# Patient Record
Sex: Male | Born: 1950 | Race: White | Hispanic: No | Smoking: Never smoker
Health system: Southern US, Community
[De-identification: ages and names within clinical notes are randomized; demographics above are authoritative.]

## PROBLEM LIST (undated history)

## (undated) DIAGNOSIS — M179 Osteoarthritis of knee, unspecified: Secondary | ICD-10-CM

## (undated) DIAGNOSIS — M1710 Unilateral primary osteoarthritis, unspecified knee: Secondary | ICD-10-CM

## (undated) DIAGNOSIS — M5430 Sciatica, unspecified side: Secondary | ICD-10-CM

## (undated) HISTORY — PX: KNEE SURGERY: SHX244

---

## 2011-03-28 ENCOUNTER — Emergency Department: Payer: Self-pay | Admitting: Emergency Medicine

## 2020-01-11 ENCOUNTER — Emergency Department: Payer: Medicare PPO

## 2020-01-11 ENCOUNTER — Inpatient Hospital Stay
Admission: EM | Admit: 2020-01-11 | Discharge: 2020-01-15 | DRG: 871 | Disposition: A | Discharge status: Skilled Nursing Facility | Payer: Medicare PPO | Attending: Internal Medicine | Admitting: Internal Medicine

## 2020-01-11 ENCOUNTER — Other Ambulatory Visit: Payer: Self-pay

## 2020-01-11 ENCOUNTER — Encounter: Payer: Self-pay | Admitting: Emergency Medicine

## 2020-01-11 DIAGNOSIS — M25511 Pain in right shoulder: Secondary | ICD-10-CM | POA: Diagnosis present

## 2020-01-11 DIAGNOSIS — R778 Other specified abnormalities of plasma proteins: Secondary | ICD-10-CM | POA: Diagnosis not present

## 2020-01-11 DIAGNOSIS — W010XXA Fall on same level from slipping, tripping and stumbling without subsequent striking against object, initial encounter: Secondary | ICD-10-CM | POA: Diagnosis present

## 2020-01-11 DIAGNOSIS — Z23 Encounter for immunization: Secondary | ICD-10-CM

## 2020-01-11 DIAGNOSIS — J189 Pneumonia, unspecified organism: Secondary | ICD-10-CM | POA: Diagnosis present

## 2020-01-11 DIAGNOSIS — Y92481 Parking lot as the place of occurrence of the external cause: Secondary | ICD-10-CM

## 2020-01-11 DIAGNOSIS — I248 Other forms of acute ischemic heart disease: Secondary | ICD-10-CM | POA: Diagnosis present

## 2020-01-11 DIAGNOSIS — R059 Cough, unspecified: Secondary | ICD-10-CM

## 2020-01-11 DIAGNOSIS — R609 Edema, unspecified: Secondary | ICD-10-CM

## 2020-01-11 DIAGNOSIS — R296 Repeated falls: Secondary | ICD-10-CM | POA: Diagnosis present

## 2020-01-11 DIAGNOSIS — S80212A Abrasion, left knee, initial encounter: Secondary | ICD-10-CM | POA: Diagnosis present

## 2020-01-11 DIAGNOSIS — M1712 Unilateral primary osteoarthritis, left knee: Secondary | ICD-10-CM | POA: Diagnosis present

## 2020-01-11 DIAGNOSIS — N170 Acute kidney failure with tubular necrosis: Secondary | ICD-10-CM | POA: Diagnosis present

## 2020-01-11 DIAGNOSIS — Z9181 History of falling: Secondary | ICD-10-CM

## 2020-01-11 DIAGNOSIS — M19011 Primary osteoarthritis, right shoulder: Secondary | ICD-10-CM | POA: Diagnosis present

## 2020-01-11 DIAGNOSIS — M75121 Complete rotator cuff tear or rupture of right shoulder, not specified as traumatic: Secondary | ICD-10-CM | POA: Diagnosis present

## 2020-01-11 DIAGNOSIS — M7531 Calcific tendinitis of right shoulder: Secondary | ICD-10-CM | POA: Diagnosis present

## 2020-01-11 DIAGNOSIS — Z20822 Contact with and (suspected) exposure to covid-19: Secondary | ICD-10-CM | POA: Diagnosis present

## 2020-01-11 DIAGNOSIS — M25462 Effusion, left knee: Secondary | ICD-10-CM | POA: Diagnosis present

## 2020-01-11 DIAGNOSIS — R7989 Other specified abnormal findings of blood chemistry: Secondary | ICD-10-CM | POA: Diagnosis present

## 2020-01-11 DIAGNOSIS — N179 Acute kidney failure, unspecified: Secondary | ICD-10-CM | POA: Diagnosis not present

## 2020-01-11 DIAGNOSIS — R6520 Severe sepsis without septic shock: Secondary | ICD-10-CM | POA: Diagnosis not present

## 2020-01-11 DIAGNOSIS — A419 Sepsis, unspecified organism: Principal | ICD-10-CM

## 2020-01-11 DIAGNOSIS — M25562 Pain in left knee: Secondary | ICD-10-CM

## 2020-01-11 DIAGNOSIS — R05 Cough: Secondary | ICD-10-CM

## 2020-01-11 HISTORY — DX: Sciatica, unspecified side: M54.30

## 2020-01-11 HISTORY — DX: Unilateral primary osteoarthritis, unspecified knee: M17.10

## 2020-01-11 HISTORY — DX: Osteoarthritis of knee, unspecified: M17.9

## 2020-01-11 LAB — CK: Total CK: 101 U/L (ref 49–397)

## 2020-01-11 LAB — LACTIC ACID, PLASMA
Lactic Acid, Venous: 1.8 mmol/L (ref 0.5–1.9)
Lactic Acid, Venous: 2.1 mmol/L (ref 0.5–1.9)

## 2020-01-11 LAB — COMPREHENSIVE METABOLIC PANEL
ALT: 58 U/L — ABNORMAL HIGH (ref 0–44)
AST: 28 U/L (ref 15–41)
Albumin: 3.3 g/dL — ABNORMAL LOW (ref 3.5–5.0)
Alkaline Phosphatase: 152 U/L — ABNORMAL HIGH (ref 38–126)
Anion gap: 12 (ref 5–15)
BUN: 95 mg/dL — ABNORMAL HIGH (ref 8–23)
CO2: 22 mmol/L (ref 22–32)
Calcium: 10.3 mg/dL (ref 8.9–10.3)
Chloride: 100 mmol/L (ref 98–111)
Creatinine, Ser: 4.23 mg/dL — ABNORMAL HIGH (ref 0.61–1.24)
GFR calc Af Amer: 16 mL/min — ABNORMAL LOW (ref >60)
GFR calc non Af Amer: 13 mL/min — ABNORMAL LOW (ref >60)
Glucose, Bld: 108 mg/dL — ABNORMAL HIGH (ref 70–99)
Potassium: 4.3 mmol/L (ref 3.5–5.1)
Sodium: 134 mmol/L — ABNORMAL LOW (ref 135–145)
Total Bilirubin: 1.4 mg/dL — ABNORMAL HIGH (ref 0.3–1.2)
Total Protein: 7.4 g/dL (ref 6.5–8.1)

## 2020-01-11 LAB — CBC
HCT: 47.8 % (ref 39.0–52.0)
HCT: 42.7 % (ref 39.0–52.0)
Hemoglobin: 16.2 g/dL (ref 13.0–17.0)
Hemoglobin: 14.5 g/dL (ref 13.0–17.0)
MCH: 29.2 pg (ref 26.0–34.0)
MCH: 29.1 pg (ref 26.0–34.0)
MCHC: 33.9 g/dL (ref 30.0–36.0)
MCHC: 34 g/dL (ref 30.0–36.0)
MCV: 86.3 fL (ref 80.0–100.0)
MCV: 85.7 fL (ref 80.0–100.0)
Platelets: 148 10*3/uL — ABNORMAL LOW (ref 150–400)
Platelets: 125 10*3/uL — ABNORMAL LOW (ref 150–400)
RBC: 5.54 MIL/uL (ref 4.22–5.81)
RBC: 4.98 MIL/uL (ref 4.22–5.81)
RDW: 14.8 % (ref 11.5–15.5)
RDW: 14.8 % (ref 11.5–15.5)
WBC: 30.6 10*3/uL — ABNORMAL HIGH (ref 4.0–10.5)
WBC: 21.5 10*3/uL — ABNORMAL HIGH (ref 4.0–10.5)
nRBC: 0 % (ref 0.0–0.2)
nRBC: 0 % (ref 0.0–0.2)

## 2020-01-11 LAB — PROCALCITONIN: Procalcitonin: 41.58 ng/mL

## 2020-01-11 LAB — POC SARS CORONAVIRUS 2 AG: SARS Coronavirus 2 Ag: NEGATIVE

## 2020-01-11 LAB — TROPONIN I (HIGH SENSITIVITY)
Troponin I (High Sensitivity): 227 ng/L (ref <18)
Troponin I (High Sensitivity): 214 ng/L (ref <18)

## 2020-01-11 MED ORDER — SODIUM CHLORIDE 0.9 % IV SOLN
2.0000 g | INTRAVENOUS | Status: DC
  Administered 2020-01-11 – 2020-01-13 (×3): 2 g via INTRAVENOUS
  Filled 2020-01-11: qty 20
  Filled 2020-01-11: qty 2
  Filled 2020-01-11: qty 20
  Filled 2020-01-11: qty 2

## 2020-01-11 MED ORDER — SODIUM CHLORIDE 0.9 % IV SOLN
500.0000 mg | INTRAVENOUS | Status: DC
  Administered 2020-01-11 – 2020-01-13 (×3): 500 mg via INTRAVENOUS
  Filled 2020-01-11 (×5): qty 500

## 2020-01-11 MED ORDER — ONDANSETRON HCL 4 MG PO TABS: 4.0000 mg | ORAL_TABLET | Freq: Four times a day (QID) | ORAL | Status: DC | PRN

## 2020-01-11 MED ORDER — ONDANSETRON HCL 4 MG/2ML IJ SOLN: 4.0000 mg | Freq: Four times a day (QID) | INTRAMUSCULAR | Status: DC | PRN

## 2020-01-11 MED ORDER — LACTATED RINGERS IV BOLUS (SEPSIS)
800.0000 mL | Freq: Once | INTRAVENOUS | Status: AC
  Administered 2020-01-12: 01:00:00 800 mL via INTRAVENOUS

## 2020-01-11 MED ORDER — ACETAMINOPHEN 325 MG PO TABS
650.0000 mg | ORAL_TABLET | Freq: Four times a day (QID) | ORAL | Status: DC | PRN
  Administered 2020-01-13: 08:00:00 650 mg via ORAL
  Filled 2020-01-11: qty 2

## 2020-01-11 MED ORDER — SODIUM CHLORIDE 0.9 % IV SOLN: 500.0000 mg | INTRAVENOUS | Status: DC

## 2020-01-11 MED ORDER — SODIUM CHLORIDE 0.9 % IV SOLN: 2.0000 g | INTRAVENOUS | Status: DC

## 2020-01-11 MED ORDER — ACETAMINOPHEN 650 MG RE SUPP: 650.0000 mg | Freq: Four times a day (QID) | RECTAL | Status: DC | PRN

## 2020-01-11 MED ORDER — LACTATED RINGERS IV BOLUS (SEPSIS): 1000.0000 mL | Freq: Once | INTRAVENOUS | Status: DC

## 2020-01-11 MED ORDER — LACTATED RINGERS IV BOLUS (SEPSIS)
1000.0000 mL | Freq: Once | INTRAVENOUS | Status: AC
  Administered 2020-01-11: 20:00:00 1000 mL via INTRAVENOUS

## 2020-01-11 MED ORDER — SENNOSIDES-DOCUSATE SODIUM 8.6-50 MG PO TABS: 1.0000 | ORAL_TABLET | Freq: Every evening | ORAL | Status: DC | PRN

## 2020-01-11 MED ORDER — LACTATED RINGERS IV BOLUS (SEPSIS)
1000.0000 mL | Freq: Once | INTRAVENOUS | Status: AC
  Administered 2020-01-11: 23:00:00 1000 mL via INTRAVENOUS

## 2020-01-11 MED ORDER — ENOXAPARIN SODIUM 40 MG/0.4ML ~~LOC~~ SOLN: 40.0000 mg | SUBCUTANEOUS | Status: DC

## 2020-01-11 MED ORDER — SODIUM CHLORIDE 0.9 % IV SOLN: INTRAVENOUS | Status: DC

## 2020-01-11 NOTE — ED Notes (Signed)
Admitting MD at bedside.

## 2020-01-11 NOTE — H&P (Signed)
History and Physical    Danny Bowman QMG:[id number] DOB: October 20, 1951 DOA: 01/11/2020  PCP: [facility]   Patient coming from: home  I have personally briefly reviewed patient's old medical records in [place]  Chief Complaint: weakness, fall, right shoulder pain  HPI: Danny Bowman is a 69 y.o. male with no significant past medical history who presented initially to the emergency room by EMS following a fall in front of an urgent care center.  Patient apparently went because of right shoulder pain and weakness but the center was closed.While leaving , he fell and EMS brought him to the ER.On arrival it was revealed that patient has had a cough and fever for the past few days and reveals that he had been very weak of late, falling a few times and had three days of diarrhea that has since resolved.  He denied chest pain or shortness of breath or Covid contacts.  Denied nausea, vomiting diaphoresis and denies abdominal pain .  ED Course: Upon arrival in the emergency room he was afebrile at 98.3 with a blood pressure of 100/63.  He was tachycardic at 93, breathing at 18 with O2 sat of 92% on room air.  His blood work was significant for white cell count of 30,000 and a creatinine of 4.23, baseline unknown.  Lactic acid was 1.8 and procalcitonin 41.  He had an elevated troponin of 227 as well as elevated ALT of 58 alk phos of 152 and bilirubin 1.4.  Chest x-ray showed "Bronchitic lung changes with streaky airspace opacities in the medial right lower lung zone raises concern for developing Pneumonia" Right shoulder x-ray showed no acute injury but did show calcific tendinitis. Patient was started on fluid bolus as well as Rocephin and azithromycin for treatment of CAP.  Hospitalist consulted for admission  Review of Systems: As per HPI otherwise 10 point review of systems negative.    Past Medical History:  Diagnosis Date  . Osteoarthritis, knee   . Sciatica      Past Surgical History:  Procedure Laterality Date  . KNEE SURGERY       reports that he has never smoked. He has never used smokeless tobacco. He reports that he does not drink alcohol or use drugs.  No Known Allergies  History reviewed. No pertinent family history.   Prior to Admission medications   Not on File    Physical Exam: Vitals:   01/11/20 1757 01/11/20 1804 01/11/20 2000  BP:  100/63 121/80  Pulse:  93 88  Resp:  18 (!) 22  Temp:  98.3 F (36.8 C)   TempSrc:  Oral   SpO2:  92% 96%  Weight: 136.1 kg    Height: 6' 4"  (1.93 m)       Vitals:   01/11/20 1757 01/11/20 1804 01/11/20 2000  BP:  100/63 121/80  Pulse:  93 88  Resp:  18 (!) 22  Temp:  98.3 F (36.8 C)   TempSrc:  Oral   SpO2:  92% 96%  Weight: 136.1 kg    Height: 6' 4"  (1.93 m)      Constitutional: NAD, alert and oriented x 3 Eyes: PERRL, lids and conjunctivae normal ENMT: Mucous membranes are moist.  Neck: normal, supple, no masses, no thyromegaly Respiratory: clear to auscultation bilaterally, no wheezing, no crackles. Normal respiratory effort. No accessory muscle use.  Cardiovascular: Regular rate and rhythm, no murmurs / rubs / gallops. No extremity edema. 2+ pedal pulses. No  carotid bruits.  Abdomen: no tenderness, no masses palpated. No hepatosplenomegaly. Bowel sounds positive.  Musculoskeletal: no clubbing / cyanosis. No joint deformity upper and lower extremities.  Skin: no rashes, lesions, ulcers.  Neurologic: No gross focal neurologic deficit. Psychiatric: Normal mood and affect.   Labs on Admission: I have personally reviewed following labs and imaging studies  CBC: Recent Labs  Lab 01/11/20 1813  WBC 30.6*  HGB 16.2  HCT 47.8  MCV 86.3  PLT 536*   Basic Metabolic Panel: Recent Labs  Lab 01/11/20 1813  NA 134*  K 4.3  CL 100  CO2 22  GLUCOSE 108*  BUN 95*  CREATININE 4.23*  CALCIUM 10.3   GFR: Estimated Creatinine Clearance: 25.2 mL/min (A) (by C-G  formula based on SCr of 4.23 mg/dL (H)). Liver Function Tests: Recent Labs  Lab 01/11/20 1813  AST 28  ALT 58*  ALKPHOS 152*  BILITOT 1.4*  PROT 7.4  ALBUMIN 3.3*   No results for input(s): LIPASE, AMYLASE in the last 168 hours. No results for input(s): AMMONIA in the last 168 hours. Coagulation Profile: No results for input(s): INR, PROTIME in the last 168 hours. Cardiac Enzymes: Recent Labs  Lab 01/11/20 1948  CKTOTAL 101   BNP (last 3 results) No results for input(s): PROBNP in the last 8760 hours. HbA1C: No results for input(s): HGBA1C in the last 72 hours. CBG: No results for input(s): GLUCAP in the last 168 hours. Lipid Profile: No results for input(s): CHOL, HDL, LDLCALC, TRIG, CHOLHDL, LDLDIRECT in the last 72 hours. Thyroid Function Tests: No results for input(s): TSH, T4TOTAL, FREET4, T3FREE, THYROIDAB in the last 72 hours. Anemia Panel: No results for input(s): VITAMINB12, FOLATE, FERRITIN, TIBC, IRON, RETICCTPCT in the last 72 hours. Urine analysis: No results found for: COLORURINE, APPEARANCEUR, LABSPEC, [place], GLUCOSEU, HGBUR, BILIRUBINUR, KETONESUR, PROTEINUR, UROBILINOGEN, NITRITE, LEUKOCYTESUR  Radiological Exams on Admission: DG Chest 1 View  Result Date: 01/11/2020 CLINICAL DATA:  Shoulder pain. EXAM: CHEST  1 VIEW COMPARISON:  None. FINDINGS: There are bronchitic changes at the lung bases bilaterally with streaky airspace opacities in the medial right lower lung zone. The right hemidiaphragm is elevated. The heart size is normal. There is no pneumothorax. No large pleural effusion. No acute osseous abnormality. IMPRESSION: Bronchitic lung changes with streaky airspace opacities in the medial right lower lung zone raises concern for developing pneumonia. Electronically Signed   By: Constance Holster M.D.   On: 01/11/2020 18:57   DG Shoulder Right  Result Date: 01/11/2020 CLINICAL DATA:  Pain EXAM: RIGHT SHOULDER - 2+ VIEW COMPARISON:  None. FINDINGS:  There is no acute displaced fracture or dislocation. There is mild-to-moderate osteoarthritis of both the glenohumeral and acromioclavicular joints. There are findings of calcific tendinosis, likely of the supraspinatus. IMPRESSION: 1. No acute displaced fracture or dislocation. 2. Mild-to-moderate osteoarthritis. 3. 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 Electronically Signed   By: Constance Holster M.D.   On: 01/11/2020 18:56    EKG: Independently reviewed.   Assessment/Plan Principal Problem:    Sepsis ([place])   CAP (community acquired pneumonia) Patient met sepsis criteria with tachycardia, white cell count of 30,000, pneumonia on chest x-ray, acute kidney injury Continue sepsis fluid bolus and management per sepsis protocol IV Rocephin and azithromycin Follow blood cultures    AKI (acute kidney injury) ([place]) Secondary to sepsis as above IV hydration Monitor renal function and avoid nephrotoxins Consult nephrology if not responding to the above measures    Elevated troponin Suspect secondary to  demand ischemia from sepsis as patient has no chest pain Troponin trending down, 227>>214 EKG ordered. Monitor for chest pain.     Right shoulder pain Calcific tendinosis seen on x-ray but no acute injury Tylenol or ibuprofen as needed    Elevated LFTs Possibly related to sepsis Continue to monitor Further work-up if worsening  Generalized weakness and multiple falls Suspect related to generalized weakness from pneumonia and sepsis Patient has no focal deficits Physical therapy eval       DVT prophylaxis: lovenox  Code Status: full code  Family Communication: none  Disposition Plan: Back to previous home environment Consults called: none     Athena Masse MD [facility]     01/11/2020, 9:20 PM

## 2020-01-11 NOTE — ED Notes (Signed)
Dr Scotty Court aware of troponin 227

## 2020-01-11 NOTE — ED Provider Notes (Signed)
[facility] Emergency Department Provider Note  ____________________________________________  Time seen: Approximately 10:04 PM  I have reviewed the triage vital signs and the nursing notes.   HISTORY  Chief Complaint Shoulder Pain    HPI Danny Bowman is a 69 y.o. male with a history of sciatica who comes the ED complaining of right shoulder pain, worse with movement.  He went to urgent care earlier today for evaluation of the shoulder pain and then tripped and fell in the parking lot resulting in worsened pain.  He denies head injury or loss of consciousness.   He also notes that over the past 4 days he is been feeling weak/fatigued, loss of appetite, malaise, nausea.  Denies vomiting or diarrhea.  Does endorse fever and chills.  Past Medical History:  Diagnosis Date  . Osteoarthritis, knee   . Sciatica      Patient Active Problem List   Diagnosis Date Noted  . CAP (community acquired pneumonia) 01/11/2020  . AKI (acute kidney injury) ([facility]) 01/11/2020  . Elevated troponin 01/11/2020  . Right shoulder pain 01/11/2020  . Sepsis ([facility]) 01/11/2020  . Elevated LFTs 01/11/2020  . Multiple falls 01/11/2020     Past Surgical History:  Procedure Laterality Date  . KNEE SURGERY       Prior to Admission medications   Medication Sig Start Date End Date Taking? Authorizing Provider  naproxen sodium (ALEVE) 220 MG tablet Take 220 mg by mouth 2 (two) times daily as needed.   Yes [provider]     Allergies Patient has no known allergies.   History reviewed. No pertinent family history.  Social History Social History   Tobacco Use  . Smoking status: Never Smoker  . Smokeless tobacco: Never Used  Substance Use Topics  . Alcohol use: Never  . Drug use: Never    Review of Systems  Constitutional:   Positive fever and chills.  ENT:   No sore throat. No rhinorrhea. Cardiovascular:   No chest pain or syncope. Respiratory:   No  dyspnea or cough. Gastrointestinal:   Negative for abdominal pain, vomiting and diarrhea.  Decreased appetite, decreased oral intake Musculoskeletal:   Positive right shoulder pain All other systems reviewed and are negative except as documented above in ROS and HPI.  ____________________________________________   PHYSICAL EXAM:  VITAL SIGNS: ED Triage Vitals  Enc Vitals Group     BP 01/11/20 1804 100/63     Pulse Rate 01/11/20 1804 93     Resp 01/11/20 1804 18     Temp 01/11/20 1804 98.3 F (36.8 C)     Temp Source 01/11/20 1804 Oral     SpO2 01/11/20 1804 92 %     Weight 01/11/20 1757 300 lb (136.1 kg)     Height 01/11/20 1757 6\' 4"  (1.93 m)     Head Circumference --      Peak Flow --      Pain Score 01/11/20 1757 8     Pain Loc --      Pain Edu? --      Excl. in GC? --     Vital signs reviewed, nursing assessments reviewed.   Constitutional:   Alert and oriented. Non-toxic appearance. Eyes:   Conjunctivae are normal. EOMI. PERRL. ENT      Head:   Normocephalic and atraumatic.      Nose:   Wearing a mask.      Mouth/Throat:   Wearing a mask.  Neck:   No meningismus. Full ROM. Hematological/Lymphatic/Immunilogical:   No cervical lymphadenopathy. Cardiovascular:   RRR. Symmetric bilateral radial and DP pulses.  No murmurs. Cap refill less than 2 seconds. Respiratory:   Normal respiratory effort without tachypnea/retractions. Breath sounds are clear and equal bilaterally. No wheezes/rales/rhonchi. Gastrointestinal:   Soft and nontender. Non distended. There is no CVA tenderness.  No rebound, rigidity, or guarding.  Musculoskeletal:   Normal range of motion in all extremities. No joint effusions.  No lower extremity tenderness.  No edema.  No focal bony tenderness over the right shoulder.  Shoulder is located.  No extreme pain with passive range of motion. Neurologic:   Normal speech and language.  Motor grossly intact. No acute focal neurologic deficits are  appreciated.  Skin:    Skin is warm, dry and intact. No rash noted.  No petechiae, purpura, or bullae.  ____________________________________________    LABS (pertinent positives/negatives) (all labs ordered are listed, but only abnormal results are displayed) Labs Reviewed  CBC - Abnormal; Notable for the following components:      Result Value   WBC 30.6 (*)    Platelets 148 (*)    All other components within normal limits  COMPREHENSIVE METABOLIC PANEL - Abnormal; Notable for the following components:   Sodium 134 (*)    Glucose, Bld 108 (*)    BUN 95 (*)    Creatinine, Ser 4.23 (*)    Albumin 3.3 (*)    ALT 58 (*)    Alkaline Phosphatase 152 (*)    Total Bilirubin 1.4 (*)    GFR calc non Af Amer 13 (*)    GFR calc Af Amer 16 (*)    All other components within normal limits  LACTIC ACID, PLASMA - Abnormal; Notable for the following components:   Lactic Acid, Venous 2.1 (*)    All other components within normal limits  TROPONIN I (HIGH SENSITIVITY) - Abnormal; Notable for the following components:   Troponin I (High Sensitivity) 227 (*)    All other components within normal limits  TROPONIN I (HIGH SENSITIVITY) - Abnormal; Notable for the following components:   Troponin I (High Sensitivity) 214 (*)    All other components within normal limits  CULTURE, BLOOD (ROUTINE X 2)  CULTURE, BLOOD (ROUTINE X 2)  URINE CULTURE  LACTIC ACID, PLASMA  CK  PROCALCITONIN  URINALYSIS, COMPLETE (UACMP) WITH MICROSCOPIC  HIV ANTIBODY (ROUTINE TESTING W REFLEX)  PROTIME-INR  CORTISOL-AM, BLOOD  PROCALCITONIN  CBC  CBC  COMPREHENSIVE METABOLIC PANEL  POC SARS CORONAVIRUS 2 AG -  ED  POC SARS CORONAVIRUS 2 AG   ____________________________________________   EKG  Interpreted by me Normal sinus rhythm rate of 93.  Left axis, normal intervals.  Poor R wave progression.  Normal ST segments and T waves.  ____________________________________________    RADIOLOGY  DG Chest 1  View  Result Date: 01/11/2020 CLINICAL DATA:  Shoulder pain. EXAM: CHEST  1 VIEW COMPARISON:  None. FINDINGS: There are bronchitic changes at the lung bases bilaterally with streaky airspace opacities in the medial right lower lung zone. The right hemidiaphragm is elevated. The heart size is normal. There is no pneumothorax. No large pleural effusion. No acute osseous abnormality. IMPRESSION: Bronchitic lung changes with streaky airspace opacities in the medial right lower lung zone raises concern for developing pneumonia. Electronically Signed   By: Katherine Mantle M.D.   On: 01/11/2020 18:57   DG Shoulder Right  Result Date: 01/11/2020 CLINICAL DATA:  Pain EXAM: RIGHT SHOULDER - 2+ VIEW COMPARISON:  None. FINDINGS: There is no acute displaced fracture or dislocation. There is mild-to-moderate osteoarthritis of both the glenohumeral and acromioclavicular joints. There are findings of calcific tendinosis, likely of the supraspinatus. IMPRESSION: 1. No acute displaced fracture or dislocation. 2. Mild-to-moderate osteoarthritis. 3. 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 Electronically Signed   By: Katherine Mantle M.D.   On: 01/11/2020 18:56    ____________________________________________   PROCEDURES .Critical Care Performed by: Sharman Cheek, MD Authorized by: Sharman Cheek, MD   Critical care provider statement:    Critical care time (minutes):  35   Critical care time was exclusive of:  Separately billable procedures and treating other patients   Critical care was necessary to treat or prevent imminent or life-threatening deterioration of the following conditions:  Sepsis   Critical care was time spent personally by me on the following activities:  Development of treatment plan with patient or surrogate, discussions with consultants, evaluation of patient's response to treatment, examination of patient, obtaining history from patient or surrogate, ordering and  performing treatments and interventions, ordering and review of laboratory studies, ordering and review of radiographic studies, pulse oximetry, re-evaluation of patient's condition and review of old charts    ____________________________________________    CLINICAL IMPRESSION / ASSESSMENT AND PLAN / ED COURSE  Medications ordered in the ED: Medications  cefTRIAXone (ROCEPHIN) 2 g in sodium chloride 0.9 % 100 mL IVPB (0 g Intravenous Stopped 01/11/20 2114)  azithromycin (ZITHROMAX) 500 mg in sodium chloride 0.9 % 250 mL IVPB (500 mg Intravenous New Bag/Given 01/11/20 2119)  enoxaparin (LOVENOX) injection 40 mg (has no administration in time range)  lactated ringers bolus 1,000 mL (has no administration in time range)    And  lactated ringers bolus 1,000 mL (has no administration in time range)    And  lactated ringers bolus 800 mL (has no administration in time range)  0.9 %  sodium chloride infusion (has no administration in time range)  cefTRIAXone (ROCEPHIN) 2 g in sodium chloride 0.9 % 100 mL IVPB (has no administration in time range)  azithromycin (ZITHROMAX) 500 mg in sodium chloride 0.9 % 250 mL IVPB (0 mg Intravenous Hold 01/11/20 2131)  acetaminophen (TYLENOL) tablet 650 mg (has no administration in time range)    Or  acetaminophen (TYLENOL) suppository 650 mg (has no administration in time range)  senna-docusate (Senokot-S) tablet 1 tablet (has no administration in time range)  ondansetron (ZOFRAN) tablet 4 mg (has no administration in time range)    Or  ondansetron (ZOFRAN) injection 4 mg (has no administration in time range)  lactated ringers bolus 1,000 mL (1,000 mLs Intravenous New Bag/Given 01/11/20 1951)    Pertinent labs & imaging results that were available during my care of the patient were reviewed by me and considered in my medical decision making (see chart for details).  ARMANY MANO was evaluated in Emergency Department on 01/11/2020 for the symptoms described  in the history of present illness. He was evaluated in the context of the global COVID-19 pandemic, which necessitated consideration that the patient might be at risk for infection with the SARS-CoV-2 virus that causes COVID-19. Institutional protocols and algorithms that pertain to the evaluation of patients at risk for COVID-19 are in a state of rapid change based on information released by regulatory bodies including the CDC and federal and state organizations. These policies and algorithms were followed during the patient's care in the ED.   Patient  presents with right shoulder pain but also reports fever and decreased oral intake for the past 4 days with generalized weakness.  Vital signs are unremarkable, exam is nonfocal.  Lab panel shows a leukocytosis of 30,000, creatinine of greater than 4.  Presumably this is all acute and concerning for early sepsis.  Chest x-ray is suggestive of bacterial pneumonia.  Point-of-care Covid test is negative.  Initial lactate is 1.8.  No hypotension, no signs of shock.  Patient given IV fluids for hydration, ceftriaxone and azithromycin for community-acquired pneumonia.  Procalcitonin markedly elevated consistent with bacterial infection.  Case discussed with the hospitalist for further management.      ____________________________________________   FINAL CLINICAL IMPRESSION(S) / ED DIAGNOSES    Final diagnoses:  Community acquired pneumonia, unspecified laterality  AKI (acute kidney injury) ([place])  Acute pain of right shoulder  Sepsis   ED Discharge Orders    None      Portions of this note were generated with dragon dictation software. Dictation errors may occur despite best attempts at proofreading.   Carrie Mew, MD 01/11/20 2208

## 2020-01-11 NOTE — Progress Notes (Signed)
CODE SEPSIS - PHARMACY COMMUNICATION  **Broad Spectrum Antibiotics should be administered within 1 hour of Sepsis diagnosis**  Time Code Sepsis Called/Page Received:  1/23 @ 2117   Antibiotics Ordered: Rocephin, Azithromycin   Time of 1st antibiotic administration: 1/23 @ 1953   Additional action taken by pharmacy:   If necessary, Name of Provider/Nurse Contacted:     Luci Bellucci D ,PharmD Clinical Pharmacist  01/11/2020  10:11 PM

## 2020-01-11 NOTE — ED Notes (Signed)
Pt given urinal and is aware of need for urine specimen. 

## 2020-01-11 NOTE — ED Triage Notes (Addendum)
Pt here for right shoulder pain that is worse with movement. Has been hurting for past week.  When talking with pt, sometimes seems to answer slowly.  Is oriented.  Pt denies injury or lifting anything heavy.  Has had pain in shoulder in past.  Something seems off with patient during triaged.  When asked about fever pt reports temp 101 earlier this week X 2 days.  Has now had cough last two days.  Admits to feeling weak and fatigued over last week.  Pt thinks he fell because today because of tripping but also being weak.  Has had multiple falls this week r/t weakness.  Decreased appetite. Skin somewhat cool and clammy.  Last bp found was 140 systolic in care everywhere.

## 2020-01-11 NOTE — ED Triage Notes (Addendum)
FIRST NURSE NOTE- right shoulder pain.  Pain increased with movement. Pt went to [facility] urgent care and they were closed.  When walking back out to car he tripped and fell and security called EMS.  VSS with EMS. CBG 156.  No LOC or new injury since fall in parking lot. No focal deficits.

## 2020-01-11 NOTE — ED Notes (Signed)
Pt transported to xray 

## 2020-01-12 ENCOUNTER — Other Ambulatory Visit: Payer: Self-pay

## 2020-01-12 LAB — URINALYSIS, COMPLETE (UACMP) WITH MICROSCOPIC
Bilirubin Urine: NEGATIVE
Glucose, UA: NEGATIVE mg/dL
Ketones, ur: NEGATIVE mg/dL
Nitrite: POSITIVE — AB
Protein, ur: NEGATIVE mg/dL
Specific Gravity, Urine: 1.015 (ref 1.005–1.030)
Squamous Epithelial / HPF: NONE SEEN (ref 0–5)
WBC, UA: >50 WBC/hpf — ABNORMAL HIGH (ref 0–5)
pH: 5 (ref 5.0–8.0)

## 2020-01-12 LAB — LACTIC ACID, PLASMA
Lactic Acid, Venous: 1.8 mmol/L (ref 0.5–1.9)
Lactic Acid, Venous: 1.2 mmol/L (ref 0.5–1.9)

## 2020-01-12 LAB — COMPREHENSIVE METABOLIC PANEL
ALT: 43 U/L (ref 0–44)
AST: 20 U/L (ref 15–41)
Albumin: 2.7 g/dL — ABNORMAL LOW (ref 3.5–5.0)
Alkaline Phosphatase: 116 U/L (ref 38–126)
Anion gap: 10 (ref 5–15)
BUN: 92 mg/dL — ABNORMAL HIGH (ref 8–23)
CO2: 24 mmol/L (ref 22–32)
Calcium: 9.6 mg/dL (ref 8.9–10.3)
Chloride: 100 mmol/L (ref 98–111)
Creatinine, Ser: 3.67 mg/dL — ABNORMAL HIGH (ref 0.61–1.24)
GFR calc Af Amer: 19 mL/min — ABNORMAL LOW (ref >60)
GFR calc non Af Amer: 16 mL/min — ABNORMAL LOW (ref >60)
Glucose, Bld: 90 mg/dL (ref 70–99)
Potassium: 3.9 mmol/L (ref 3.5–5.1)
Sodium: 134 mmol/L — ABNORMAL LOW (ref 135–145)
Total Bilirubin: 1 mg/dL (ref 0.3–1.2)
Total Protein: 6.4 g/dL — ABNORMAL LOW (ref 6.5–8.1)

## 2020-01-12 LAB — CBC
HCT: 41.8 % (ref 39.0–52.0)
Hemoglobin: 14.4 g/dL (ref 13.0–17.0)
MCH: 29.5 pg (ref 26.0–34.0)
MCHC: 34.4 g/dL (ref 30.0–36.0)
MCV: 85.7 fL (ref 80.0–100.0)
Platelets: 135 10*3/uL — ABNORMAL LOW (ref 150–400)
RBC: 4.88 MIL/uL (ref 4.22–5.81)
RDW: 14.8 % (ref 11.5–15.5)
WBC: 20.1 10*3/uL — ABNORMAL HIGH (ref 4.0–10.5)
nRBC: 0 % (ref 0.0–0.2)

## 2020-01-12 LAB — PROCALCITONIN: Procalcitonin: 32.09 ng/mL

## 2020-01-12 LAB — HIV ANTIBODY (ROUTINE TESTING W REFLEX): HIV Screen 4th Generation wRfx: NONREACTIVE

## 2020-01-12 LAB — PROTIME-INR
INR: 1 (ref 0.8–1.2)
Prothrombin Time: 13.4 seconds (ref 11.4–15.2)

## 2020-01-12 LAB — SARS CORONAVIRUS 2 (TAT 6-24 HRS): SARS Coronavirus 2: NEGATIVE

## 2020-01-12 LAB — CORTISOL-AM, BLOOD: Cortisol - AM: 31.5 ug/dL — ABNORMAL HIGH (ref 6.7–22.6)

## 2020-01-12 MED ORDER — ENOXAPARIN SODIUM 30 MG/0.3ML ~~LOC~~ SOLN
30.0000 mg | SUBCUTANEOUS | Status: DC
  Filled 2020-01-12 (×2): qty 0.3

## 2020-01-12 MED ORDER — PNEUMOCOCCAL VAC POLYVALENT 25 MCG/0.5ML IJ INJ
0.5000 mL | INJECTION | INTRAMUSCULAR | Status: AC
  Administered 2020-01-13: 09:00:00 0.5 mL via INTRAMUSCULAR
  Filled 2020-01-12: qty 0.5

## 2020-01-12 MED ORDER — INFLUENZA VAC A&B SA ADJ QUAD 0.5 ML IM PRSY
0.5000 mL | PREFILLED_SYRINGE | INTRAMUSCULAR | Status: AC
  Administered 2020-01-13: 11:00:00 0.5 mL via INTRAMUSCULAR
  Filled 2020-01-12 (×2): qty 0.5

## 2020-01-12 NOTE — ED Notes (Signed)
Pt lying in bed awake watching television. Pt denies pain at this time and was encouraged to call out to staff for any new needs or concerns

## 2020-01-12 NOTE — Progress Notes (Signed)
Notified Dr Para March of slight increase in lactic acid. IVF per CMS guidelines has been given since lactic acid has been drawn. Requested to repeat lactic acid to ensure downward trend.Order placed by Dr Para March

## 2020-01-12 NOTE — ED Notes (Signed)
Patient assisted by this RN and Vernell Morgans to hospital bed at this time. Patients clothing and bed saturated in urine noted during transfer. Patient bathed/cleaned with incontinent cleanser. Patient made aware to press call bell when needing to urinate and he can be assisted with urinal.

## 2020-01-12 NOTE — ED Notes (Signed)
Patient unable to sit up or stand up without two person assist. Once standing, patient was able to pivot to other bed.

## 2020-01-12 NOTE — Progress Notes (Signed)
PHARMACIST - PHYSICIAN COMMUNICATION  CONCERNING:  Enoxaparin (Lovenox) for DVT Prophylaxis    RECOMMENDATION: Patient was prescribed enoxaprin 40mg  q24 hours for VTE prophylaxis.   Filed Weights   01/11/20 1757  Weight: 300 lb (136.1 kg)    Body mass index is 36.52 kg/m.  Estimated Creatinine Clearance: 25.2 mL/min (A) (by C-G formula based on SCr of 4.23 mg/dL (H)).   Patient is candidate for enoxaparin 30mg  every 24 hours based on CrCl <60ml/min.   DESCRIPTION: Pharmacy has adjusted enoxaparin dose per [facility] policy.   Patient is now receiving enoxaparin 30mg  every 24 hours.  31m, PharmD Clinical Pharmacist  01/12/2020 12:25 AM

## 2020-01-12 NOTE — ED Notes (Signed)
Patient set up with breakfast tray at this time 

## 2020-01-12 NOTE — Progress Notes (Signed)
PROGRESS NOTE    Danny Bowman  IDP:[id number] DOB: 15-Nov-1951 DOA: 01/11/2020 PCP: [facility]    Assessment & Plan:   Principal Problem:   Sepsis ([place]) Active Problems:   CAP (community acquired pneumonia)   AKI (acute kidney injury) ([place])   Elevated troponin   Right shoulder pain   Elevated LFTs   Multiple falls    Danny Bowman is a 69 y.o. Caucasian male with no significant past medical history who presented initially to the emergency room by EMS following a fall in front of an urgent care center.    Sepsis ([place]) 2/2 CAP (community acquired pneumonia) Patient met sepsis criteria with tachycardia, white cell count of 30,000, pneumonia on chest x-ray, acute kidney injury.  Procal also very elevated at 41.  No hypoxia. PLAN: --continue IV Rocephin and azithromycin    AKI (acute kidney injury) ([place]), improved Secondary to sepsis as above --Cr 4.23 on presentation.  No recent baseline.  S/p IV hydration --continue MIVF    Elevated troponin Suspect secondary to demand ischemia from sepsis as patient has no chest pain Troponin trending down, 227>>214 EKG no ACS-related changes.     Right shoulder pain Calcific tendinosis seen on x-ray but no acute injury Tylenol or ibuprofen as needed --outpatient ortho followup    Elevated LFTs, mild Possibly related to sepsis Continue to monitor Further work-up if worsening  Generalized weakness and multiple falls Patient has no focal deficits Physical therapy eval    DVT prophylaxis: Lovenox SQ Code Status: Full code  Family Communication: not today Disposition Plan: Undetermined.  Pt lives alone at home.   Subjective and Interval History:  Pt complained of right shoulder pain and some knee pain.  Started making more urine after presentation.  No fever, dyspnea, cough, chest pain, abdominal pain, N/V/D, dysuria, increased swelling.   Objective: Vitals:   01/12/20 0831 01/12/20 1051  01/12/20 1130 01/12/20 1301  BP: 118/75 103/63 125/72 (!) 142/79  Pulse: 84 69 86 87  Resp: _0 Temp: 98.4 F (36.9 C)   97.6 F (36.4 C)  TempSrc: Oral   Oral  SpO2: 95% 94% 98% 94%  Weight:      Height:        Intake/Output Summary (Last 24 hours) at 01/12/2020 1822 Last data filed at 01/12/2020 4431 Gross per 24 hour  Intake 100 ml  Output 600 ml  Net -500 ml   Filed Weights   01/11/20 1757  Weight: 136.1 kg    Examination:   Constitutional: NAD, AAOx3 HEENT: conjunctivae and lids normal, EOMI CV: RRR no M,R,G. Distal pulses +2.  No cyanosis.   RESP: CTA B/L, normal respiratory effort, on RA GI: +BS, NTND Extremities: No effusions, edema, or tenderness in BLE SKIN: warm, dry.  Minor scratches over left knee. Neuro: II - XII grossly intact.  Sensation intact Psych: Normal mood and affect.     Data Reviewed: I have personally reviewed following labs and imaging studies  CBC: Recent Labs  Lab 01/11/20 1813 01/11/20 2234 01/12/20 0306  WBC 30.6* 21.5* 20.1*  HGB 16.2 14.5 14.4  HCT 47.8 42.7 41.8  MCV 86.3 85.7 85.7  PLT 148* 125* 540*   Basic Metabolic Panel: Recent Labs  Lab 01/11/20 1813 01/12/20 0306  NA 134* 134*  K 4.3 3.9  CL 100 100  CO2 22 24  GLUCOSE 108* 90  BUN 95* 92*  CREATININE 4.23* 3.67*  CALCIUM 10.3 9.6  GFR: Estimated Creatinine Clearance: 29 mL/min (A) (by C-G formula based on SCr of 3.67 mg/dL (H)). Liver Function Tests: Recent Labs  Lab 01/11/20 1813 01/12/20 0306  AST 28 20  ALT 58* 43  ALKPHOS 152* 116  BILITOT 1.4* 1.0  PROT 7.4 6.4*  ALBUMIN 3.3* 2.7*   No results for input(s): LIPASE, AMYLASE in the last 168 hours. No results for input(s): AMMONIA in the last 168 hours. Coagulation Profile: Recent Labs  Lab 01/12/20 0306  INR 1.0   Cardiac Enzymes: Recent Labs  Lab 01/11/20 1948  CKTOTAL 101   BNP (last 3 results) No results for input(s): PROBNP in the last 8760 hours. HbA1C: No results  for input(s): HGBA1C in the last 72 hours. CBG: No results for input(s): GLUCAP in the last 168 hours. Lipid Profile: No results for input(s): CHOL, HDL, LDLCALC, TRIG, CHOLHDL, LDLDIRECT in the last 72 hours. Thyroid Function Tests: No results for input(s): TSH, T4TOTAL, FREET4, T3FREE, THYROIDAB in the last 72 hours. Anemia Panel: No results for input(s): VITAMINB12, FOLATE, FERRITIN, TIBC, IRON, RETICCTPCT in the last 72 hours. Sepsis Labs: Recent Labs  Lab 01/11/20 1948 01/11/20 2114 01/12/20 0055 01/12/20 0306  PROCALCITON 41.58  --   --  32.09  LATICACIDVEN 1.8 2.1* 1.8 1.2    Recent Results (from the past 240 hour(s))  Blood Culture (routine x 2)     Status: None (Preliminary result)   Collection Time: 01/11/20  7:48 PM   Specimen: BLOOD  Result Value Ref Range Status   Specimen Description BLOOD LEFT ANTECUBITAL  Final   Special Requests   Final    BOTTLES DRAWN AEROBIC AND ANAEROBIC Blood Culture adequate volume   Culture   Final    NO GROWTH < 12 HOURS Performed at [facility], [address], [place], [place] [address]    Report Status PENDING  Incomplete  Blood Culture (routine x 2)     Status: None (Preliminary result)   Collection Time: 01/11/20  7:48 PM   Specimen: BLOOD LEFT HAND  Result Value Ref Range Status   Specimen Description BLOOD LEFT HAND  Final   Special Requests   Final    BOTTLES DRAWN AEROBIC AND ANAEROBIC Blood Culture adequate volume   Culture   Final    NO GROWTH < 12 HOURS Performed at [facility], [address] [place], [place] [address]    Report Status PENDING  Incomplete  SARS CORONAVIRUS 2 (TAT 6-24 HRS) Nasopharyngeal Nasopharyngeal Swab     Status: None   Collection Time: 01/11/20 11:02 PM   Specimen: Nasopharyngeal Swab  Result Value Ref Range Status   SARS Coronavirus 2 NEGATIVE NEGATIVE Final    Comment: (NOTE) SARS-CoV-2 target nucleic acids are NOT DETECTED. The SARS-CoV-2 RNA is generally  detectable in upper and lower respiratory specimens during the acute phase of infection. Negative results do not preclude SARS-CoV-2 infection, do not rule out co-infections with other pathogens, and should not be used as the sole basis for treatment or other patient management decisions. Negative results must be combined with clinical observations, patient history, and epidemiological information. The expected result is Negative. Fact Sheet for Patients: SugarRoll.be Fact Sheet for Healthcare Providers: https://www.woods-mathews.com/ This test is not yet approved or cleared by the [place] FDA and  has been authorized for detection and/or diagnosis of SARS-CoV-2 by FDA under an Emergency Use Authorization (EUA). This EUA will remain  in effect (meaning this test can be used) for the duration  of the COVID-19 declaration under Section 56 4(b)(1) of the Act, 21 U.S.C. section 360bbb-3(b)(1), unless the authorization is terminated or revoked sooner. Performed at [facility], [place] [address], [place], [place] [address]       Radiology Studies: DG Chest 1 View  Result Date: 01/11/2020 CLINICAL DATA:  Shoulder pain. EXAM: CHEST  1 VIEW COMPARISON:  None. FINDINGS: There are bronchitic changes at the lung bases bilaterally with streaky airspace opacities in the medial right lower lung zone. The right hemidiaphragm is elevated. The heart size is normal. There is no pneumothorax. No large pleural effusion. No acute osseous abnormality. IMPRESSION: Bronchitic lung changes with streaky airspace opacities in the medial right lower lung zone raises concern for developing pneumonia. Electronically Signed   By: Constance Holster M.D.   On: 01/11/2020 18:57   DG Shoulder Right  Result Date: 01/11/2020 CLINICAL DATA:  Pain EXAM: RIGHT SHOULDER - 2+ VIEW COMPARISON:  None. FINDINGS: There is no acute displaced fracture or dislocation. There is  mild-to-moderate osteoarthritis of both the glenohumeral and acromioclavicular joints. There are findings of calcific tendinosis, likely of the supraspinatus. IMPRESSION: 1. No acute displaced fracture or dislocation. 2. Mild-to-moderate osteoarthritis. 3. 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 Electronically Signed   By: Constance Holster M.D.   On: 01/11/2020 18:56     Scheduled Meds: . enoxaparin (LOVENOX) injection  30 mg Subcutaneous Q24H  . [START ON 01/13/2020] influenza vaccine adjuvanted  0.5 mL Intramuscular Tomorrow-1000  . [START ON 01/13/2020] pneumococcal 23 valent vaccine  0.5 mL Intramuscular Tomorrow-1000   Continuous Infusions: . sodium chloride 100 mL/hr at 01/12/20 1302  . azithromycin Stopped (01/11/20 2219)  . cefTRIAXone (ROCEPHIN)  IV Stopped (01/11/20 2114)  . lactated ringers       LOS: 1 day     Enzo Bi, MD [facility] If 7PM-7AM, please contact night-coverage 01/12/2020, 6:22 PM

## 2020-01-13 ENCOUNTER — Inpatient Hospital Stay: Payer: Medicare PPO

## 2020-01-13 LAB — CBC
HCT: 40.4 % (ref 39.0–52.0)
Hemoglobin: 13.6 g/dL (ref 13.0–17.0)
MCH: 29 pg (ref 26.0–34.0)
MCHC: 33.7 g/dL (ref 30.0–36.0)
MCV: 86.1 fL (ref 80.0–100.0)
Platelets: 171 10*3/uL (ref 150–400)
RBC: 4.69 MIL/uL (ref 4.22–5.81)
RDW: 14.9 % (ref 11.5–15.5)
WBC: 13.5 10*3/uL — ABNORMAL HIGH (ref 4.0–10.5)
nRBC: 0 % (ref 0.0–0.2)

## 2020-01-13 LAB — BASIC METABOLIC PANEL
Anion gap: 11 (ref 5–15)
BUN: 75 mg/dL — ABNORMAL HIGH (ref 8–23)
CO2: 20 mmol/L — ABNORMAL LOW (ref 22–32)
Calcium: 9.3 mg/dL (ref 8.9–10.3)
Chloride: 104 mmol/L (ref 98–111)
Creatinine, Ser: 2.81 mg/dL — ABNORMAL HIGH (ref 0.61–1.24)
GFR calc Af Amer: 26 mL/min — ABNORMAL LOW (ref >60)
GFR calc non Af Amer: 22 mL/min — ABNORMAL LOW (ref >60)
Glucose, Bld: 88 mg/dL (ref 70–99)
Potassium: 3.8 mmol/L (ref 3.5–5.1)
Sodium: 135 mmol/L (ref 135–145)

## 2020-01-13 LAB — URINE CULTURE

## 2020-01-13 LAB — MAGNESIUM: Magnesium: 1.7 mg/dL (ref 1.7–2.4)

## 2020-01-13 MED ORDER — ACETAMINOPHEN 500 MG PO TABS
1000.0000 mg | ORAL_TABLET | Freq: Three times a day (TID) | ORAL | Status: DC
  Administered 2020-01-13 – 2020-01-15 (×7): 1000 mg via ORAL
  Filled 2020-01-13 (×8): qty 2

## 2020-01-13 MED ORDER — ENOXAPARIN SODIUM 40 MG/0.4ML ~~LOC~~ SOLN
40.0000 mg | SUBCUTANEOUS | Status: DC
  Administered 2020-01-13 – 2020-01-15 (×3): 40 mg via SUBCUTANEOUS
  Filled 2020-01-13 (×3): qty 0.4

## 2020-01-13 NOTE — Evaluation (Signed)
Physical Therapy Evaluation Patient Details Name: Danny Bowman MRN: [id number] DOB: 1950/12/20 Today's Date: 01/13/2020   History of Present Illness  Per MD notes: Pt is a 69 y.o. male with no significant past medical history who presented initially to the emergency room by EMS following a fall in front of an urgent care center with c/o cough, fever, multiple falls, and weakness.  MD assessment includes: CAP with sepsis, AKI, elevated troponin secondary to demand ischemia, R shoulder pain with x-ray showing no acute injury, elevated LFTs, and general weakness/falls.    Clinical Impression  Pt pleasant and motivated during the session but ultimately required extensive physical assistance with all functional tasks.  Pt able to clear the surface of the bed during multiple sit to/from stand transfer attempts from various height surfaces but required +2 max A and was unable to come to complete upright standing.  Pt required to return to sitting in <5 sec after clearing the surface of the bed.  Pt presents with a significant decline in function compared to his recent baseline and would not be safe to return to his prior living situation at this time.  Pt will benefit from PT services in a SNF setting upon discharge to safely address deficits listed in patient problem list for decreased caregiver assistance and eventual return to PLOF.     Follow Up Recommendations SNF    Equipment Recommendations  Rolling walker with 5" wheels    Recommendations for Other Services       Precautions / Restrictions Precautions Precautions: Fall Restrictions Weight Bearing Restrictions: No      Mobility  Bed Mobility Overal bed mobility: Needs Assistance Bed Mobility: Supine to Sit;Sit to Supine     Supine to sit: +2 for physical assistance;Max assist Sit to supine: +2 for physical assistance;Max assist   General bed mobility comments: +2 Max A for LLE and trunk control  Transfers Overall transfer  level: Needs assistance Equipment used: Rolling walker (2 wheeled) Transfers: Sit to/from Stand Sit to Stand: From elevated surface;Max assist;+2 physical assistance         General transfer comment: Pt able to clear the surface of the bed but unable to come to complete upright standing and required to return to sitting in <5 sec  Ambulation/Gait             General Gait Details: Unable  Stairs            Wheelchair Mobility    Modified Rankin (Stroke Patients Only)       Balance Overall balance assessment: Needs assistance   Sitting balance-Leahy Scale: Good     Standing balance support: Bilateral upper extremity supported Standing balance-Leahy Scale: Poor                               Pertinent Vitals/Pain Pain Assessment: 0-10 Pain Score: 8  Pain Location: R shoulder and L knee Pain Descriptors / Indicators: Aching;Sore Pain Intervention(s): Premedicated before session;Monitored during session    Home Living Family/patient expects to be discharged to:: Private residence Living Arrangements: Alone Available Help at Discharge: Family;Available PRN/intermittently Type of Home: Mobile home Home Access: Stairs to enter Entrance Stairs-Rails: Right;Left;Can reach both Entrance Stairs-Number of Steps: 3 Home Layout: One level Home Equipment: Grab bars - tub/shower;Grab bars - toilet;Shower seat;Cane - quad;Wheelchair - manual Additional Comments: Lift chair    Prior Function Level of Independence: Independent  Comments: Ind amb community distances without an AD, Ind with ADLs, 3 falls in the last year all within the last week secondary to weakness     Hand Dominance        Extremity/Trunk Assessment   Upper Extremity Assessment Upper Extremity Assessment: RUE deficits/detail RUE Deficits / Details: RUE too painful to assess with noted swelling, nsg aware RUE: Unable to fully assess due to pain    Lower Extremity  Assessment Lower Extremity Assessment: Generalized weakness;LLE deficits/detail LLE Deficits / Details: L knee ext and hip flex strength <3/5 but limited by knee pain per patient LLE: Unable to fully assess due to pain       Communication   Communication: No difficulties  Cognition Arousal/Alertness: Awake/alert Behavior During Therapy: WFL for tasks assessed/performed Overall Cognitive Status: Within Functional Limits for tasks assessed                                        General Comments      Exercises Total Joint Exercises Ankle Circles/Pumps: Strengthening;AROM;Both;5 reps;10 reps Quad Sets: AROM;Strengthening;Both;5 reps;10 reps Gluteal Sets: Strengthening;Both;5 reps;10 reps Hip ABduction/ADduction: AAROM;Left;10 reps Straight Leg Raises: AAROM;Left;10 reps Long Arc Quad: AROM;Both;5 reps;10 reps Knee Flexion: AROM;Both;5 reps;10 reps Other Exercises Other Exercises: HEP education for BLE APs, QS, GS, and LAQ x 10 each 5-6x/day Other Exercises: Multiple sit to/from stand transfer training from various height surfaces Other Exercises: Multiple rolling left/right training   Assessment/Plan    PT Assessment Patient needs continued PT services  PT Problem List Decreased strength;Decreased range of motion;Decreased activity tolerance;Decreased balance;Decreased mobility;Pain;Decreased knowledge of use of DME       PT Treatment Interventions DME instruction;Gait training;Stair training;Functional mobility training;Therapeutic activities;Therapeutic exercise;Balance training;Patient/family education    PT Goals (Current goals can be found in the Care Plan section)  Acute Rehab PT Goals Patient Stated Goal: Decreased R shoulder and L knee pain and return to walking PT Goal Formulation: With patient Time For Goal Achievement: 01/26/20 Potential to Achieve Goals: Good    Frequency Min 2X/week   Barriers to discharge Inaccessible home  environment;Decreased caregiver support      Co-evaluation               AM-PAC PT "6 Clicks" Mobility  Outcome Measure Help needed turning from your back to your side while in a flat bed without using bedrails?: Total Help needed moving from lying on your back to sitting on the side of a flat bed without using bedrails?: Total Help needed moving to and from a bed to a chair (including a wheelchair)?: Total Help needed standing up from a chair using your arms (e.g., wheelchair or bedside chair)?: Total Help needed to walk in hospital room?: Total Help needed climbing 3-5 steps with a railing? : Total 6 Click Score: 6    End of Session Equipment Utilized During Treatment: Gait belt Activity Tolerance: Patient limited by pain Patient left: in bed;with call bell/phone within reach;with bed alarm set Nurse Communication: Mobility status;Other (comment)(Pt's c/o L knee pain) PT Visit Diagnosis: History of falling (Z91.81);Difficulty in walking, not elsewhere classified (R26.2);Muscle weakness (generalized) (M62.81);Pain Pain - Right/Left: Right(And L knee) Pain - part of body: Shoulder    Time: 5277-8242 PT Time Calculation (min) (ACUTE ONLY): 46 min   Charges:   PT Evaluation $PT Eval Moderate Complexity: 1 Mod PT Treatments $Therapeutic Exercise: 8-22 mins $Therapeutic Activity:  8-22 mins        D. Royetta Asal PT, DPT 01/13/20, 12:16 PM

## 2020-01-13 NOTE — Progress Notes (Signed)
OT Cancellation Note  Patient Details Name: Danny Bowman MRN: [id number] DOB: 1951/04/04   Cancelled Treatment:    Reason Eval/Treat Not Completed: Patient at procedure or test/ unavailable  OT consult received and chart reviewed. Upon OT presenting to pt room, pt had just left floor for ultrasound. Will f/u as able for OT evaluation. Thank you.  Rejeana Brock, MS, OTR/L ascom [phone] 01/13/20, 1:29 PM

## 2020-01-13 NOTE — TOC Initial Note (Signed)
Transition of Care [facility]) - Initial/Assessment Note    Patient Details  Name: Danny Bowman MRN: [id number] Date of Birth: Sep 02, 1951  [facility] CM/SW Contact:    Trenton Founds, RN Phone Number: 01/13/2020, 1:11 PM  Clinical Narrative:   RNCM assessed patient at bedside, patient completing lunch. Patient reports to living at home alone however after discussion reports he lives with his wife and her daughter assist them. Patient reports to feeling better than when he came in but does not feel at all strong enough to get home. Patient reports ongoing issues with his knee but they seemed to be worsened by falls he was having prior to coming in. Patient reports that he does still drive and he is able to get back and forth to doctor's appointments and to pick up his medications. Patient reports he gets his medications from [facility] in [place]. Discussed with patient that PT is recommending he go to a rehab facility for which patient is agreeable too. Discussed any area facilities that patient would be willing to go to and he reports that he is open to any facility that would accept his insurance. Discussed with patient that this CM would send out bed requests to local facilities and would get back in touch with him once bed offers are made. CM will continue to follow.            Expected Discharge Plan: Skilled Nursing Facility Barriers to Discharge: No Barriers Identified   Patient Goals and CMS Choice Patient states their goals for this hospitalization and ongoing recovery are:: to get my knee straightened out. CMS Medicare.gov Compare Post Acute Care list provided to:: Patient Choice offered to / list presented to : Patient  Expected Discharge Plan and Services Expected Discharge Plan: Skilled Nursing Facility   Discharge Planning Services: CM Consult Post Acute Care Choice: Skilled Nursing Facility Living arrangements for the past 2 months: Single Family Home                                       Prior Living Arrangements/Services Living arrangements for the past 2 months: Single Family Home Lives with:: Spouse Patient language and need for interpreter reviewed:: Yes Do you feel safe going back to the place where you live?: Yes      Need for Family Participation in Patient Care: Yes (Comment) Care giver support system in place?: Yes (comment)   Criminal Activity/Legal Involvement Pertinent to Current Situation/Hospitalization: No - Comment as needed  Activities of Daily Living Home Assistive Devices/Equipment: None ADL Screening (condition at time of admission) Patient's cognitive ability adequate to safely complete daily activities?: Yes Is the patient deaf or have difficulty hearing?: No Does the patient have difficulty seeing, even when wearing glasses/contacts?: No Does the patient have difficulty concentrating, remembering, or making decisions?: No Patient able to express need for assistance with ADLs?: Yes Does the patient have difficulty dressing or bathing?: No Independently performs ADLs?: Yes (appropriate for developmental age) Does the patient have difficulty walking or climbing stairs?: Yes Weakness of Legs: Both Weakness of Arms/Hands: Both  Permission Sought/Granted                  Emotional Assessment Appearance:: Appears stated age Attitude/Demeanor/Rapport: Engaged Affect (typically observed): Appropriate Orientation: : Oriented to Self, Oriented to Place, Oriented to  Time, Oriented to Situation Alcohol / Substance Use: Never Used Psych  Involvement: No (comment)  Admission diagnosis:  Cough [R05] AKI (acute kidney injury) ([place]) [N17.9] Sepsis ([place]) [A41.9] Acute pain of right shoulder [M25.511] Community acquired pneumonia, unspecified laterality [J18.9] Patient Active Problem List   Diagnosis Date Noted  . CAP (community acquired pneumonia) 01/11/2020  . AKI (acute kidney injury) ([place]) 01/11/2020  . Elevated  troponin 01/11/2020  . Right shoulder pain 01/11/2020  . Sepsis ([place]) 01/11/2020  . Elevated LFTs 01/11/2020  . Multiple falls 01/11/2020   PCP:  [facility] Pharmacy:  No Pharmacies Listed    Social Determinants of Health (SDOH) Interventions    Readmission Risk Interventions No flowsheet data found.

## 2020-01-13 NOTE — Progress Notes (Signed)
PROGRESS NOTE    Danny Bowman  JIR:[id number] DOB: 1951/09/02 DOA: 01/11/2020 PCP: [facility]    Assessment & Plan:   Principal Problem:   Sepsis ([place]) Active Problems:   CAP (community acquired pneumonia)   AKI (acute kidney injury) ([place])   Elevated troponin   Right shoulder pain   Elevated LFTs   Multiple falls    Danny Bowman is a 69 y.o. Caucasian male with no significant past medical history who presented initially to the emergency room by EMS following a fall in front of an urgent care center.    Sepsis ([place]) 2/2 CAP (community acquired pneumonia) Patient met sepsis criteria with tachycardia, white cell count of 30,000, pneumonia on chest x-ray, acute kidney injury.  Procal also very elevated at 41.  No hypoxia. PLAN: --continue IV Rocephin and azithromycin    AKI (acute kidney injury) ([place]), improved Secondary to sepsis as above --Cr 4.23 on presentation.  No recent baseline.  S/p IV hydration --d/c MIVF today since pt is now having good oral intake and hydration    Elevated troponin Suspect secondary to demand ischemia from sepsis as patient has no chest pain Troponin trending down, 227>>214 EKG no ACS-related changes.    Right shoulder pain Calcific tendinosis seen on x-ray but no acute injury Tylenol or ibuprofen as needed --outpatient ortho followup    Elevated LFTs, mild Possibly related to sepsis Continue to monitor Further work-up if worsening  Generalized weakness and multiple falls Patient has no focal deficits Physical therapy eval   Left knee pain --Likely swollen from the fall --xray of left knee today --tylenol 1g TID scheduled --cold compress  Right arm swelling, new --US DVT study   DVT prophylaxis: Lovenox SQ Code Status: Full code  Family Communication: not today Disposition Plan: Home with [place] vs SNF rehab.  Medically ready in 1-2 days, depending on how his kidney function recovers.  Pt lives alone  at home.  PT ordered.   Subjective and Interval History:  Pt complained of worsening left knee pain.  Right shoulder pain actually improved.  No fever, dyspnea, cough, chest pain, abdominal pain, N/V/D, dysuria.      Objective: Vitals:   01/12/20 2001 01/13/20 0424 01/13/20 0800 01/13/20 1423  BP: 130/83 122/66 (!) 149/83 (!) 92/50  Pulse: 87 86 91 83  Resp: (!) 22 20 18  (!) 24  Temp: 98 F (36.7 C) 98 F (36.7 C) 98.3 F (36.8 C) 98.5 F (36.9 C)  TempSrc: Oral Oral Oral Oral  SpO2: 95% 94% 96% 94%  Weight:      Height:        Intake/Output Summary (Last 24 hours) at 01/13/2020 2051 Last data filed at 01/13/2020 1700 Gross per 24 hour  Intake 1865.34 ml  Output 2450 ml  Net -584.66 ml   Filed Weights   01/11/20 1757  Weight: 136.1 kg    Examination:   Constitutional: NAD, AAOx3 HEENT: conjunctivae and lids normal, EOMI CV: RRR no M,R,G. Distal pulses +2.  No cyanosis.   RESP: CTA B/L, normal respiratory effort, on RA GI: +BS, NTND Extremities: No effusions, edema, or tenderness in BLE.  RUE swollen. SKIN: warm, dry.  Minor scratches over left knee. Neuro: II - XII grossly intact.  Sensation intact Psych: Normal mood and affect.     Data Reviewed: I have personally reviewed following labs and imaging studies  CBC: Recent Labs  Lab 01/11/20 1813 01/11/20 2234 01/12/20 0306 01/13/20 0441  WBC  30.6* 21.5* 20.1* 13.5*  HGB 16.2 14.5 14.4 13.6  HCT 47.8 42.7 41.8 40.4  MCV 86.3 85.7 85.7 86.1  PLT 148* 125* 135* 349   Basic Metabolic Panel: Recent Labs  Lab 01/11/20 1813 01/12/20 0306 01/13/20 0441  NA 134* 134* 135  K 4.3 3.9 3.8  CL 100 100 104  CO2 22 24 20*  GLUCOSE 108* 90 88  BUN 95* 92* 75*  CREATININE 4.23* 3.67* 2.81*  CALCIUM 10.3 9.6 9.3  MG  --   --  1.7   GFR: Estimated Creatinine Clearance: 37.9 mL/min (A) (by C-G formula based on SCr of 2.81 mg/dL (H)). Liver Function Tests: Recent Labs  Lab 01/11/20 1813 01/12/20 0306    AST 28 20  ALT 58* 43  ALKPHOS 152* 116  BILITOT 1.4* 1.0  PROT 7.4 6.4*  ALBUMIN 3.3* 2.7*   No results for input(s): LIPASE, AMYLASE in the last 168 hours. No results for input(s): AMMONIA in the last 168 hours. Coagulation Profile: Recent Labs  Lab 01/12/20 0306  INR 1.0   Cardiac Enzymes: Recent Labs  Lab 01/11/20 1948  CKTOTAL 101   BNP (last 3 results) No results for input(s): PROBNP in the last 8760 hours. HbA1C: No results for input(s): HGBA1C in the last 72 hours. CBG: No results for input(s): GLUCAP in the last 168 hours. Lipid Profile: No results for input(s): CHOL, HDL, LDLCALC, TRIG, CHOLHDL, LDLDIRECT in the last 72 hours. Thyroid Function Tests: No results for input(s): TSH, T4TOTAL, FREET4, T3FREE, THYROIDAB in the last 72 hours. Anemia Panel: No results for input(s): VITAMINB12, FOLATE, FERRITIN, TIBC, IRON, RETICCTPCT in the last 72 hours. Sepsis Labs: Recent Labs  Lab 01/11/20 1948 01/11/20 2114 01/12/20 0055 01/12/20 0306  PROCALCITON 41.58  --   --  32.09  LATICACIDVEN 1.8 2.1* 1.8 1.2    Recent Results (from the past 240 hour(s))  Blood Culture (routine x 2)     Status: None (Preliminary result)   Collection Time: 01/11/20  7:48 PM   Specimen: BLOOD  Result Value Ref Range Status   Specimen Description BLOOD LEFT ANTECUBITAL  Final   Special Requests   Final    BOTTLES DRAWN AEROBIC AND ANAEROBIC Blood Culture adequate volume   Culture   Final    NO GROWTH 2 DAYS Performed at [facility], [address] [place], [place] [address]    Report Status PENDING  Incomplete  Blood Culture (routine x 2)     Status: None (Preliminary result)   Collection Time: 01/11/20  7:48 PM   Specimen: BLOOD LEFT HAND  Result Value Ref Range Status   Specimen Description BLOOD LEFT HAND  Final   Special Requests   Final    BOTTLES DRAWN AEROBIC AND ANAEROBIC Blood Culture adequate volume   Culture   Final    NO GROWTH 2 DAYS Performed at  [facility], [address], [place], [place] [address]    Report Status PENDING  Incomplete  SARS CORONAVIRUS 2 (TAT 6-24 HRS) Nasopharyngeal Nasopharyngeal Swab     Status: None   Collection Time: 01/11/20 11:02 PM   Specimen: Nasopharyngeal Swab  Result Value Ref Range Status   SARS Coronavirus 2 NEGATIVE NEGATIVE Final    Comment: (NOTE) SARS-CoV-2 target nucleic acids are NOT DETECTED. The SARS-CoV-2 RNA is generally detectable in upper and lower respiratory specimens during the acute phase of infection. Negative results do not preclude SARS-CoV-2 infection, do not rule out co-infections with other pathogens,  and should not be used as the sole basis for treatment or other patient management decisions. Negative results must be combined with clinical observations, patient history, and epidemiological information. The expected result is Negative. Fact Sheet for Patients: SugarRoll.be Fact Sheet for Healthcare Providers: https://www.woods-mathews.com/ This test is not yet approved or cleared by the [place] FDA and  has been authorized for detection and/or diagnosis of SARS-CoV-2 by FDA under an Emergency Use Authorization (EUA). This EUA will remain  in effect (meaning this test can be used) for the duration of the COVID-19 declaration under Section 56 4(b)(1) of the Act, 21 U.S.C. section 360bbb-3(b)(1), unless the authorization is terminated or revoked sooner. Performed at [facility], [place] [address], [place], [place] [address]   Urine culture     Status: Abnormal   Collection Time: 01/12/20 12:55 AM   Specimen: Urine, Random  Result Value Ref Range Status   Specimen Description   Final    URINE, RANDOM Performed at [facility], [address], [place], [place] [address]    Special Requests   Final    NONE Performed at [facility], [address] [place], [place] [address]    Culture  MULTIPLE SPECIES PRESENT, SUGGEST RECOLLECTION (A)  Final   Report Status 01/13/2020 FINAL  Final      Radiology Studies: DG Knee 1-2 Views Left  Result Date: 01/13/2020 CLINICAL DATA:  Left knee pain EXAM: LEFT KNEE - 1-2 VIEW COMPARISON:  None. FINDINGS: No evidence for an acute fracture or dislocation. Loss of joint space noted lateral compartment. Meniscal calcification noted medial compartment. Hypertrophic spurring visible in all 3 compartments in there is a large joint effusion in the suprapatellar bursa. 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 Electronically Signed   By: Misty Stanley M.D.   On: 01/13/2020 12:57   US Venous Img Upper Uni Right(DVT)  Result Date: 01/13/2020 CLINICAL DATA:  69 year old male with a history swelling EXAM: RIGHT UPPER EXTREMITY VENOUS DOPPLER ULTRASOUND TECHNIQUE: Gray-scale sonography with graded compression, as well as color Doppler and duplex ultrasound were performed to evaluate the upper extremity deep venous system from the level of the subclavian vein and including the jugular, axillary, basilic, radial, ulnar and upper cephalic vein. Spectral Doppler was utilized to evaluate flow at rest and with distal augmentation maneuvers. COMPARISON:  None. FINDINGS: Contralateral Subclavian Vein: Respiratory phasicity is normal and symmetric with the symptomatic side. No evidence of thrombus. Normal compressibility. Internal Jugular Vein: No evidence of thrombus. Normal compressibility, respiratory phasicity and response to augmentation. Subclavian Vein: No evidence of thrombus. Normal compressibility, respiratory phasicity and response to augmentation. Axillary Vein: No evidence of thrombus. Normal compressibility, respiratory phasicity and response to augmentation. Cephalic Vein: No evidence of thrombus. Normal compressibility, respiratory phasicity and response to augmentation. Basilic Vein: No evidence of thrombus. Normal  compressibility, respiratory phasicity and response to augmentation. Brachial Veins: No evidence of thrombus. Normal compressibility, respiratory phasicity and response to augmentation. Radial Veins: No evidence of thrombus. Normal compressibility, respiratory phasicity and response to augmentation. Ulnar Veins: No evidence of thrombus. Normal compressibility, respiratory phasicity and response to augmentation. Other Findings:  None visualized. IMPRESSION: Sonographic survey of the right upper extremity negative for DVT Electronically Signed   By: Corrie Mckusick D.O.   On: 01/13/2020 14:13     Scheduled Meds: . acetaminophen  1,000 mg Oral TID  . enoxaparin (LOVENOX) injection  40 mg Subcutaneous Q24H   Continuous Infusions: . azithromycin 500  mg (01/13/20 2002)  . cefTRIAXone (ROCEPHIN)  IV Stopped (01/12/20 1918)  . lactated ringers       LOS: 2 days     Enzo Bi, MD [facility] If 7PM-7AM, please contact night-coverage 01/13/2020, 8:51 PM

## 2020-01-13 NOTE — Progress Notes (Signed)
Pt. Returned from procedure and VS assessed. Resting comfortably at this time with a pain rating of 4/10 that he denies needing pain medication for. Pt. Is hemodynamically stable at this time and will continue to monitor for changes.

## 2020-01-13 NOTE — Progress Notes (Signed)
Notified Dr. Fran Lowes patients BP is 92/50 RR 24 and MEWS is a 2. No new orders given

## 2020-01-13 NOTE — NC FL2 (Signed)
[place] MEDICAID FL2 LEVEL OF CARE SCREENING TOOL     IDENTIFICATION  Patient Name: Danny Bowman Birthdate: 04-03-1951 Sex: male Admission Date (Current Location): 01/11/2020  [place] and [place] Number:  Chiropodist and Address:  [facility], [address] [place], [place] [address]      Provider Number: [id number]  Attending Physician Name and Address:  Darlin Priestly, MD  Relative Name and Phone Number:       Current Level of Care: Hospital Recommended Level of Care: Skilled Nursing Facility Prior Approval Number:    Date Approved/Denied:   PASRR Number: [id number]  Discharge Plan: SNF    Current Diagnoses: Patient Active Problem List   Diagnosis Date Noted  . CAP (community acquired pneumonia) 01/11/2020  . AKI (acute kidney injury) ([facility]) 01/11/2020  . Elevated troponin 01/11/2020  . Right shoulder pain 01/11/2020  . Sepsis ([facility]) 01/11/2020  . Elevated LFTs 01/11/2020  . Multiple falls 01/11/2020    Orientation RESPIRATION BLADDER Height & Weight     Self, Time, Situation, Place  Normal Continent Weight: 136.1 kg Height:  6\' 4"  (193 cm)  BEHAVIORAL SYMPTOMS/MOOD NEUROLOGICAL BOWEL NUTRITION STATUS      Continent Diet(Regular)  AMBULATORY STATUS COMMUNICATION OF NEEDS Skin   Extensive Assist Verbally Normal                       Personal Care Assistance Level of Assistance  Bathing, Dressing Bathing Assistance: Limited assistance   Dressing Assistance: Limited assistance     Functional Limitations Info             SPECIAL CARE FACTORS FREQUENCY  PT (By licensed PT), OT (By licensed OT)                    Contractures Contractures Info: Not present    Additional Factors Info  Code Status, Allergies Code Status Info: Full Allergies Info: No known Drug Allergies           Current Medications (01/13/2020):  This is the current hospital active medication list Current Facility-Administered  Medications  Medication Dose Route Frequency Provider Last Rate Last Admin  . acetaminophen (TYLENOL) tablet 1,000 mg  1,000 mg Oral TID 01/15/2020, MD   1,000 mg at 01/13/20 1050  . azithromycin (ZITHROMAX) 500 mg in sodium chloride 0.9 % 250 mL IVPB  500 mg Intravenous Q24H 01/15/20, MD   Stopped at 01/12/20 2025  . cefTRIAXone (ROCEPHIN) 2 g in sodium chloride 0.9 % 100 mL IVPB  2 g Intravenous Q24H 01/14/20, MD   Stopped at 01/12/20 1918  . enoxaparin (LOVENOX) injection 40 mg  40 mg Subcutaneous Q24H 01/14/20, RPH   40 mg at 01/13/20 01/15/20  . lactated ringers bolus 1,000 mL  1,000 mL Intravenous Once 1696, MD      . ondansetron [facility]) tablet 4 mg  4 mg Oral Q6H PRN [facility], MD       Or  . ondansetron [facility]) injection 4 mg  4 mg Intravenous Q6H PRN [facility], MD      . senna-docusate (Senokot-S) tablet 1 tablet  1 tablet Oral QHS PRN Andris Baumann, MD         Discharge Medications: Please see discharge summary for a list of discharge medications.  Relevant Imaging Results:  Relevant Lab Results:   Additional Information    Andris Baumann, RN

## 2020-01-14 LAB — CBC
HCT: 42 % (ref 39.0–52.0)
Hemoglobin: 14 g/dL (ref 13.0–17.0)
MCH: 29.2 pg (ref 26.0–34.0)
MCHC: 33.3 g/dL (ref 30.0–36.0)
MCV: 87.5 fL (ref 80.0–100.0)
Platelets: 225 10*3/uL (ref 150–400)
RBC: 4.8 MIL/uL (ref 4.22–5.81)
RDW: 15 % (ref 11.5–15.5)
WBC: 15.1 10*3/uL — ABNORMAL HIGH (ref 4.0–10.5)
nRBC: 0 % (ref 0.0–0.2)

## 2020-01-14 LAB — MAGNESIUM: Magnesium: 1.9 mg/dL (ref 1.7–2.4)

## 2020-01-14 LAB — BASIC METABOLIC PANEL
Anion gap: 8 (ref 5–15)
BUN: 68 mg/dL — ABNORMAL HIGH (ref 8–23)
CO2: 25 mmol/L (ref 22–32)
Calcium: 9.5 mg/dL (ref 8.9–10.3)
Chloride: 104 mmol/L (ref 98–111)
Creatinine, Ser: 2.52 mg/dL — ABNORMAL HIGH (ref 0.61–1.24)
GFR calc Af Amer: 29 mL/min — ABNORMAL LOW (ref >60)
GFR calc non Af Amer: 25 mL/min — ABNORMAL LOW (ref >60)
Glucose, Bld: 79 mg/dL (ref 70–99)
Potassium: 4.3 mmol/L (ref 3.5–5.1)
Sodium: 137 mmol/L (ref 135–145)

## 2020-01-14 MED ORDER — SENNA 8.6 MG PO TABS
1.0000 | ORAL_TABLET | Freq: Every day | ORAL | Status: DC
  Filled 2020-01-14: qty 1

## 2020-01-14 MED ORDER — LEVOFLOXACIN 500 MG PO TABS
500.0000 mg | ORAL_TABLET | Freq: Every day | ORAL | Status: DC
  Administered 2020-01-15: 08:00:00 500 mg via ORAL
  Filled 2020-01-14: qty 1

## 2020-01-14 NOTE — Progress Notes (Signed)
Dr. Fran Lowes wanted patient to have a cold compress order, I ordered it

## 2020-01-14 NOTE — Care Management Important Message (Signed)
Important Message  Patient Details  Name: Danny Bowman MRN: [id number] Date of Birth: 25-Mar-1951   Medicare Important Message Given:  Yes  Initial Medicare IM given by Patient Access Associate on 01/13/2020 at 1302.    Johnell Comings 01/14/2020, 8:29 AM

## 2020-01-14 NOTE — TOC Transition Note (Signed)
Transition of Care [facility]) - CM/SW Discharge Note   Patient Details  Name: Danny Bowman MRN: [id number] Date of Birth: [birth year]  [facility] CM/SW Contact:  Trenton Founds, RN Phone Number: 01/14/2020, 2:40 PM   Clinical Narrative:   Patient accepted bed offer from [facility], authorization started reference # [id number], clinicals faxed to [phone].       Barriers to Discharge: No Barriers Identified   Patient Goals and CMS Choice Patient states their goals for this hospitalization and ongoing recovery are:: to get my knee straightened out. CMS Medicare.gov Compare Post Acute Care list provided to:: Patient Choice offered to / list presented to : Patient  Discharge Placement                       Discharge Plan and Services   Discharge Planning Services: CM Consult Post Acute Care Choice: Skilled Nursing Facility                               Social Determinants of Health (SDOH) Interventions     Readmission Risk Interventions No flowsheet data found.

## 2020-01-14 NOTE — Evaluation (Signed)
Occupational Therapy Evaluation Patient Details Name: Danny Bowman MRN: [id number] DOB: February 28, 1951 Today's Date: 01/14/2020    History of Present Illness Per MD notes: Pt is a 69 y.o. male with no significant past medical history who presented initially to the emergency room by EMS following a fall in front of an urgent care center with c/o cough, fever, multiple falls, and weakness.  MD assessment includes: CAP with sepsis, AKI, elevated troponin secondary to demand ischemia, R shoulder pain with x-ray showing no acute injury, elevated LFTs, and general weakness/falls.   Clinical Impression   Mr. Twist was seen for OT evaluation this date. Prior to onset of generalized weakness and subsequent hospital admission, pt was active and independent. Independent for ADL/IADL management, driving, and getting out in the community when possible. Pt endorses that his wife passed away ~2 months ago, and he has simply been trying to adjust to his new routines and life without her. Pt lives alone in a 1 level mobile home with three steps to enter and several safety modifications including grab bars in the tub/shower and by the commode. Currently pt demonstrates impairments as described below (See OT problem list) which functionally limit his ability to perform ADL/self-care tasks. Pt currently requires +2 max assist for bed mobility. He has not yet been able to tolerate functional mobility 2/2 significant pain, swelling, and weakness in his RUE as well as LLE. Pt bed level for toileting, grooming, and self feeding.  Pt would benefit from skilled OT services to address noted impairments and functional limitations (see below for any additional details) in order to maximize safety and independence while minimizing falls risk and caregiver burden. Upon hospital discharge, recommend STR to maximize pt safety and return to PLOF.      Follow Up Recommendations  SNF    Equipment Recommendations  3 in 1 bedside commode     Recommendations for Other Services       Precautions / Restrictions Precautions Precautions: Fall Restrictions Weight Bearing Restrictions: No      Mobility Bed Mobility Overal bed mobility: Needs Assistance             General bed mobility comments: Deferred for pt safety/comfort. Per chart, pt req max A +2 for all bed mboility. See PT note.  Transfers                      Balance Overall balance assessment: Needs assistance                                         ADL either performed or assessed with clinical judgement   ADL Overall ADL's : Needs assistance/impaired                                       General ADL Comments: Pt requires +2 max A for bed and transfer attempts. Unable to perform AROM of his RUE/LLE which significantly functionally limit his ability to perform ADL/self care tasks. Currently rquires set-up assist for bed level grooming/self-feeding using only his LUE (non-dominant). +2 Max assist for seated dressing/bathing. Bed level toileting at this time.     Vision Baseline Vision/History: Wears glasses Wears Glasses: At all times Patient Visual Report: No change from baseline       Perception  Praxis      Pertinent Vitals/Pain Pain Assessment: 0-10 Pain Score: 5  Pain Location: R shoulder and L knee Pain Descriptors / Indicators: Aching;Sore Pain Intervention(s): Limited activity within patient's tolerance;Monitored during session;Repositioned     Hand Dominance Right   Extremity/Trunk Assessment Upper Extremity Assessment Upper Extremity Assessment: (LUE grossly WFL.) RUE Deficits / Details: RUE too painful to assess with noted swelling, nsg aware, immaging negative for acute injury/DVT as of OT evaluation. Pt unable to tolerate even gentle touch. Can wiggle fingers at IP joints. States thumb is more painful this date. Can shrug shoulder minimally. Educated on safe positioning and ROM  exercises to tolerance. RUE: Unable to fully assess due to pain RUE Coordination: decreased gross motor;decreased fine motor   Lower Extremity Assessment Lower Extremity Assessment: LLE deficits/detail LLE Deficits / Details: Pt endorses L Knee is very painful. Decreased ability to engage in AROM. LLE: Unable to fully assess due to pain LLE Coordination: decreased gross motor;decreased fine motor       Communication Communication Communication: No difficulties   Cognition Arousal/Alertness: Awake/alert Behavior During Therapy: WFL for tasks assessed/performed Overall Cognitive Status: Within Functional Limits for tasks assessed                                     General Comments  Pt noted to have brusing on L knee as well as increased edema/swelling in his RUE and LLE this date. RN aware.    Exercises General Exercises - Upper Extremity Shoulder Flexion: Self ROM;Right;Limitations;Left;AROM Shoulder Flexion Limitations: Pt unable to tolerate RUE movement this date. Educated on self-ROM strategies to promote comfort, skin integrity, and edema management. Elbow Flexion: Self ROM;Right;Left;Limitations;AROM Elbow Flexion Limitations: See above Elbow Extension: Right;Left;Limitations;Self ROM;AROM Wrist Extension: Left;Right;Limitations;AROM;Self ROM Digit Composite Flexion: Both;5 reps;Limitations;AROM Composite Extension: Both;5 reps;Limitations;AROM Other Exercises Other Exercises: Pt educated in compensatory strategies for self-feeding/self-care using his LUE (non-dominant) only, safe positioning and ROM exercises for BUE with encouragement for self-ROM with RUE within pt tolerance, and role of OT in acute care setting. Pt return verbalizes understanding of education provided. would benefit from review/reinforcement.   Shoulder Instructions      Home Living Family/patient expects to be discharged to:: Private residence Living Arrangements: Alone Available Help  at Discharge: Family;Available PRN/intermittently Type of Home: Mobile home Home Access: Stairs to enter Entrance Stairs-Number of Steps: 3 Entrance Stairs-Rails: Right;Left;Can reach both Home Layout: One level     Bathroom Shower/Tub: Teacher, early years/pre: Handicapped height     Home Equipment: Grab bars - tub/shower;Grab bars - toilet;Shower seat;Cane - quad;Wheelchair - manual   Additional Comments: Lift chair      Prior Functioning/Environment Level of Independence: Independent        Comments: Ind amb community distances without an AD, Ind with ADLs, 3 falls in the last year all within the last week secondary to weakness        OT Problem List: Decreased strength;Decreased coordination;Pain;Impaired UE functional use;Impaired balance (sitting and/or standing);Decreased knowledge of use of DME or AE;Decreased activity tolerance;Decreased range of motion;Increased edema      OT Treatment/Interventions: Self-care/ADL training;Therapeutic exercise;Therapeutic activities;Patient/family education;DME and/or AE instruction;Balance training    OT Goals(Current goals can be found in the care plan section) Acute Rehab OT Goals Patient Stated Goal: Decreased R shoulder and L knee pain and return to walking OT Goal Formulation: With patient Time For Goal Achievement:  01/28/20 Potential to Achieve Goals: Good ADL Goals Pt Will Perform Grooming: sitting;with set-up;with supervision Pt Will Perform Upper Body Dressing: sitting;with adaptive equipment;with min assist(With LRAD PRN for improved safety and functional independence.) Pt Will Perform Lower Body Dressing: sit to/from stand;with mod assist;with adaptive equipment(With LRAD PRN for improved safety and functional independence.) Additional ADL Goal #1: Pt will independently verbalize a plan to implement at least 3 learned falls prevention strategies into his daily routines/home environment for improved safety and  funcitonal independence upon hospital DC.  OT Frequency: Min 2X/week   Barriers to D/C: Inaccessible home environment;Decreased caregiver support          Co-evaluation              AM-PAC OT "6 Clicks" Daily Activity     Outcome Measure Help from another person eating meals?: A Little Help from another person taking care of personal grooming?: A Little Help from another person toileting, which includes using toliet, bedpan, or urinal?: Total Help from another person bathing (including washing, rinsing, drying)?: A Lot Help from another person to put on and taking off regular upper body clothing?: A Lot Help from another person to put on and taking off regular lower body clothing?: A Lot 6 Click Score: 13   End of Session    Activity Tolerance: Patient limited by pain Patient left: in bed;with call bell/phone within reach;with bed alarm set  OT Visit Diagnosis: Other abnormalities of gait and mobility (R26.89);Muscle weakness (generalized) (M62.81);History of falling (Z91.81);Pain Pain - Right/Left: Right Pain - part of body: Shoulder;Arm;Hand                Time: 1829-9371 OT Time Calculation (min): 20 min Charges:  OT General Charges $OT Visit: 1 Visit OT Evaluation $OT Eval Moderate Complexity: 1 Mod OT Treatments $Self Care/Home Management : 8-22 mins  Rockney Ghee, M.S., OTR/L Ascom: [phone] 01/14/20, 10:51 AM

## 2020-01-14 NOTE — Progress Notes (Signed)
Physical Therapy Treatment Patient Details Name: Danny Bowman MRN: [id number] DOB: April 07, 1951 Today's Date: 01/14/2020    History of Present Illness Per MD notes: Pt is a 69 y.o. male with no significant past medical history who presented initially to the emergency room by EMS following a fall in front of an urgent care center with c/o cough, fever, multiple falls, and weakness.  MD assessment includes: CAP with sepsis, AKI, elevated troponin secondary to demand ischemia, R shoulder pain with x-Starlene Consuegra showing no acute injury, elevated LFTs, and general weakness/falls.    PT Comments    Pt is making limited progress towards goals with ability to participate in supine there-ex. Applied pillow for elevation and ice to L knee. Upon lifting, pt moans out in pain. Unable to tolerate further mobility efforts on L LE. Strengthening performed on R LE and R UE with education for pain control. Unable to perform OOB mobility this date. Will continue to progress.   Follow Up Recommendations  SNF     Equipment Recommendations  Rolling walker with 5" wheels    Recommendations for Other Services       Precautions / Restrictions Precautions Precautions: Fall Restrictions Weight Bearing Restrictions: No    Mobility  Bed Mobility               General bed mobility comments: deferred as pt complaining of severe pain.  Transfers                    Ambulation/Gait                 Stairs             Wheelchair Mobility    Modified Rankin (Stroke Patients Only)       Balance                                            Cognition Arousal/Alertness: Awake/alert Behavior During Therapy: WFL for tasks assessed/performed Overall Cognitive Status: Within Functional Limits for tasks assessed                                        Exercises Other Exercises Other Exercises: R grip squeezes, wrist circles, wrist flex/ext, elbow  flexion/extension, and shoulder abduction. All ther-ex performed x 10 reps on R UE. Also performed R LE quad sets, SLRs, hip abd/add, resisted knee flexion. All ther-ex performed x 10 reps with cga.    General Comments        Pertinent Vitals/Pain Pain Assessment: Faces Faces Pain Scale: Hurts worst Pain Location: R shoulder and L knee Pain Descriptors / Indicators: Aching;Sore Pain Intervention(s): Limited activity within patient's tolerance;Repositioned    Home Living                      Prior Function            PT Goals (current goals can now be found in the care plan section) Acute Rehab PT Goals Patient Stated Goal: Decreased R shoulder and L knee pain and return to walking PT Goal Formulation: With patient Time For Goal Achievement: 01/26/20 Potential to Achieve Goals: Good Progress towards PT goals: Progressing toward goals    Frequency    Min 2X/week  PT Plan Current plan remains appropriate    Co-evaluation              AM-PAC PT "6 Clicks" Mobility   Outcome Measure  Help needed turning from your back to your side while in a flat bed without using bedrails?: Total Help needed moving from lying on your back to sitting on the side of a flat bed without using bedrails?: Total Help needed moving to and from a bed to a chair (including a wheelchair)?: Total Help needed standing up from a chair using your arms (e.g., wheelchair or bedside chair)?: Total Help needed to walk in hospital room?: Total Help needed climbing 3-5 steps with a railing? : Total 6 Click Score: 6    End of Session   Activity Tolerance: Patient limited by pain Patient left: in bed;with call bell/phone within reach;with bed alarm set Nurse Communication: Mobility status;Other (comment) PT Visit Diagnosis: History of falling (Z91.81);Difficulty in walking, not elsewhere classified (R26.2);Muscle weakness (generalized) (M62.81);Pain Pain - Right/Left: Right Pain -  part of body: Shoulder     Time: 5638-7564 PT Time Calculation (min) (ACUTE ONLY): 23 min  Charges:  $Therapeutic Exercise: 23-37 mins                     Greggory Stallion, PT, DPT [phone]    Lakeithia Rasor 01/14/2020, 3:39 PM

## 2020-01-14 NOTE — Progress Notes (Signed)
PROGRESS NOTE    Danny Bowman  ESP:[id number] DOB: 02/17/1951 DOA: 01/11/2020 PCP: [facility]    Assessment & Plan:   Principal Problem:   Sepsis ([place]) Active Problems:   CAP (community acquired pneumonia)   AKI (acute kidney injury) ([place])   Elevated troponin   Right shoulder pain   Elevated LFTs   Multiple falls    Danny Bowman is a 69 y.o. Caucasian male with no significant past medical history who presented initially to the emergency room by EMS following a fall in front of an urgent care center.    Sepsis ([place]) 2/2 CAP (community acquired pneumonia) Patient met sepsis criteria with tachycardia, white cell count of 30,000, pneumonia on chest x-ray, acute kidney injury.  Procal also very elevated at 41.  No hypoxia.  Pt was started on IV Rocephin and azithromycin. PLAN: --Finished 3 days of IV Rocephin and azithromycin today.  Will transition to PO Levaquin tomorrow for 2 more days to finish a 5-day course.    AKI (acute kidney injury) ([place]), improved Secondary to sepsis as above --Cr 4.23 on presentation.  No recent baseline.  S/p IV hydration.  MIVF d/c'ed on 1/25 after pt start having good oral intake and hydration.    Elevated troponin Suspect secondary to demand ischemia from sepsis as patient has no chest pain Troponin trending down, 227>>214 EKG no ACS-related changes.    Right shoulder pain Calcific tendinosis seen on x-ray but no acute injury --outpatient ortho followup    Elevated LFTs, mild Possibly related to sepsis Continue to monitor Further work-up if worsening  Generalized weakness and multiple falls Patient has no focal deficits Physical therapy eval, rec SNF rehab  Left knee pain --Likely swollen from the fall --xray of left knee showed large joint effusion and degenerative joint disease, but no fracture or misalignment.  Curb-side consulted ortho today who agreed that in general, joint effusion from trauma does not  need to be aspirated, since it will return and also the procedure itself risks infection. PLAN: --tylenol 1g TID scheduled --apply cold compress  Right arm swelling, new --US DVT study neg for DVT.     DVT prophylaxis: Lovenox SQ Code Status: Full code  Family Communication: not today Disposition Plan: PT rec SNF.  Pt can be discharged tomorrow with PO abx for PNA if Cr continues to improve.     Subjective and Interval History:  Pt complained of worsening left knee pain.  Right shoulder pain actually improved.  No fever, dyspnea, cough, chest pain, abdominal pain, N/V/D, dysuria.      Objective: Vitals:   01/13/20 1423 01/13/20 2128 01/14/20 0530 01/14/20 1135  BP: (!) 92/50 120/68 115/72 117/64  Pulse: 83 81 87 88  Resp: (!) 24  20 18   Temp: 98.5 F (36.9 C) 97.8 F (36.6 C) (!) 97.5 F (36.4 C) 98.2 F (36.8 C)  TempSrc: Oral Oral Oral Oral  SpO2: 94% 94% 95% 93%  Weight:      Height:        Intake/Output Summary (Last 24 hours) at 01/14/2020 1425 Last data filed at 01/14/2020 1000 Gross per 24 hour  Intake 630 ml  Output 1600 ml  Net -970 ml   Filed Weights   01/11/20 1757  Weight: 136.1 kg    Examination:   Constitutional: NAD, AAOx3 HEENT: conjunctivae and lids normal, EOMI CV: RRR no M,R,G. Distal pulses +2.  No cyanosis.   RESP: CTA B/L, normal respiratory effort, on  RA GI: +BS, NTND Extremities: No effusions, edema, or tenderness in BLE.  RUE swollen. SKIN: warm, dry.  Minor scratches over left knee. Neuro: II - XII grossly intact.  Sensation intact Psych: Normal mood and affect.     Data Reviewed: I have personally reviewed following labs and imaging studies  CBC: Recent Labs  Lab 01/11/20 1813 01/11/20 2234 01/12/20 0306 01/13/20 0441 01/14/20 0408  WBC 30.6* 21.5* 20.1* 13.5* 15.1*  HGB 16.2 14.5 14.4 13.6 14.0  HCT 47.8 42.7 41.8 40.4 42.0  MCV 86.3 85.7 85.7 86.1 87.5  PLT 148* 125* 135* 171 242   Basic Metabolic Panel: Recent  Labs  Lab 01/11/20 1813 01/12/20 0306 01/13/20 0441 01/14/20 0408  NA 134* 134* 135 137  K 4.3 3.9 3.8 4.3  CL 100 100 104 104  CO2 22 24 20* 25  GLUCOSE 108* 90 88 79  BUN 95* 92* 75* 68*  CREATININE 4.23* 3.67* 2.81* 2.52*  CALCIUM 10.3 9.6 9.3 9.5  MG  --   --  1.7 1.9   GFR: Estimated Creatinine Clearance: 42.3 mL/min (A) (by C-G formula based on SCr of 2.52 mg/dL (H)). Liver Function Tests: Recent Labs  Lab 01/11/20 1813 01/12/20 0306  AST 28 20  ALT 58* 43  ALKPHOS 152* 116  BILITOT 1.4* 1.0  PROT 7.4 6.4*  ALBUMIN 3.3* 2.7*   No results for input(s): LIPASE, AMYLASE in the last 168 hours. No results for input(s): AMMONIA in the last 168 hours. Coagulation Profile: Recent Labs  Lab 01/12/20 0306  INR 1.0   Cardiac Enzymes: Recent Labs  Lab 01/11/20 1948  CKTOTAL 101   BNP (last 3 results) No results for input(s): PROBNP in the last 8760 hours. HbA1C: No results for input(s): HGBA1C in the last 72 hours. CBG: No results for input(s): GLUCAP in the last 168 hours. Lipid Profile: No results for input(s): CHOL, HDL, LDLCALC, TRIG, CHOLHDL, LDLDIRECT in the last 72 hours. Thyroid Function Tests: No results for input(s): TSH, T4TOTAL, FREET4, T3FREE, THYROIDAB in the last 72 hours. Anemia Panel: No results for input(s): VITAMINB12, FOLATE, FERRITIN, TIBC, IRON, RETICCTPCT in the last 72 hours. Sepsis Labs: Recent Labs  Lab 01/11/20 1948 01/11/20 2114 01/12/20 0055 01/12/20 0306  PROCALCITON 41.58  --   --  32.09  LATICACIDVEN 1.8 2.1* 1.8 1.2    Recent Results (from the past 240 hour(s))  Blood Culture (routine x 2)     Status: None (Preliminary result)   Collection Time: 01/11/20  7:48 PM   Specimen: BLOOD  Result Value Ref Range Status   Specimen Description BLOOD LEFT ANTECUBITAL  Final   Special Requests   Final    BOTTLES DRAWN AEROBIC AND ANAEROBIC Blood Culture adequate volume   Culture   Final    NO GROWTH 3 DAYS Performed at  [facility], [address], [place], [place] [address]    Report Status PENDING  Incomplete  Blood Culture (routine x 2)     Status: None (Preliminary result)   Collection Time: 01/11/20  7:48 PM   Specimen: BLOOD LEFT HAND  Result Value Ref Range Status   Specimen Description BLOOD LEFT HAND  Final   Special Requests   Final    BOTTLES DRAWN AEROBIC AND ANAEROBIC Blood Culture adequate volume   Culture   Final    NO GROWTH 3 DAYS Performed at [facility], [address], [place], [place] [address]    Report Status PENDING  Incomplete  SARS CORONAVIRUS 2 (TAT 6-24 HRS) Nasopharyngeal Nasopharyngeal Swab     Status: None   Collection Time: 01/11/20 11:02 PM   Specimen: Nasopharyngeal Swab  Result Value Ref Range Status   SARS Coronavirus 2 NEGATIVE NEGATIVE Final    Comment: (NOTE) SARS-CoV-2 target nucleic acids are NOT DETECTED. The SARS-CoV-2 RNA is generally detectable in upper and lower respiratory specimens during the acute phase of infection. Negative results do not preclude SARS-CoV-2 infection, do not rule out co-infections with other pathogens, and should not be used as the sole basis for treatment or other patient management decisions. Negative results must be combined with clinical observations, patient history, and epidemiological information. The expected result is Negative. Fact Sheet for Patients: SugarRoll.be Fact Sheet for Healthcare Providers: https://www.woods-mathews.com/ This test is not yet approved or cleared by the [place] FDA and  has been authorized for detection and/or diagnosis of SARS-CoV-2 by FDA under an Emergency Use Authorization (EUA). This EUA will remain  in effect (meaning this test can be used) for the duration of the COVID-19 declaration under Section 56 4(b)(1) of the Act, 21 U.S.C. section 360bbb-3(b)(1), unless the authorization is terminated or revoked  sooner. Performed at [facility], [place] [address] [place], [place] [address]   Urine culture     Status: Abnormal   Collection Time: 01/12/20 12:55 AM   Specimen: Urine, Random  Result Value Ref Range Status   Specimen Description   Final    URINE, RANDOM Performed at [facility], [address] [place], [place] [address]    Special Requests   Final    NONE Performed at [facility], [address] [place], [place] [address]    Culture MULTIPLE SPECIES PRESENT, SUGGEST RECOLLECTION (A)  Final   Report Status 01/13/2020 FINAL  Final      Radiology Studies: DG Knee 1-2 Views Left  Result Date: 01/13/2020 CLINICAL DATA:  Left knee pain EXAM: LEFT KNEE - 1-2 VIEW COMPARISON:  None. FINDINGS: No evidence for an acute fracture or dislocation. Loss of joint space noted lateral compartment. Meniscal calcification noted medial compartment. Hypertrophic spurring visible in all 3 compartments in there is a large joint effusion in the suprapatellar bursa. 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 Electronically Signed   By: Misty Stanley M.D.   On: 01/13/2020 12:57   US Venous Img Upper Uni Right(DVT)  Result Date: 01/13/2020 CLINICAL DATA:  69 year old male with a history swelling EXAM: RIGHT UPPER EXTREMITY VENOUS DOPPLER ULTRASOUND TECHNIQUE: Gray-scale sonography with graded compression, as well as color Doppler and duplex ultrasound were performed to evaluate the upper extremity deep venous system from the level of the subclavian vein and including the jugular, axillary, basilic, radial, ulnar and upper cephalic vein. Spectral Doppler was utilized to evaluate flow at rest and with distal augmentation maneuvers. COMPARISON:  None. FINDINGS: Contralateral Subclavian Vein: Respiratory phasicity is normal and symmetric with the symptomatic side. No evidence of thrombus. Normal compressibility. Internal Jugular Vein: No evidence of  thrombus. Normal compressibility, respiratory phasicity and response to augmentation. Subclavian Vein: No evidence of thrombus. Normal compressibility, respiratory phasicity and response to augmentation. Axillary Vein: No evidence of thrombus. Normal compressibility, respiratory phasicity and response to augmentation. Cephalic Vein: No evidence of thrombus. Normal compressibility, respiratory phasicity and response to augmentation. Basilic Vein: No evidence of thrombus. Normal compressibility, respiratory phasicity and response to augmentation. Brachial Veins: No evidence of thrombus. Normal compressibility, respiratory phasicity and response to augmentation. Radial Veins:  No evidence of thrombus. Normal compressibility, respiratory phasicity and response to augmentation. Ulnar Veins: No evidence of thrombus. Normal compressibility, respiratory phasicity and response to augmentation. Other Findings:  None visualized. IMPRESSION: Sonographic survey of the right upper extremity negative for DVT Electronically Signed   By: Corrie Mckusick D.O.   On: 01/13/2020 14:13     Scheduled Meds:  acetaminophen  1,000 mg Oral TID   enoxaparin (LOVENOX) injection  40 mg Subcutaneous Q24H   senna  1 tablet Oral QHS   Continuous Infusions:  azithromycin Stopped (01/13/20 2102)   cefTRIAXone (ROCEPHIN)  IV Stopped (01/13/20 2159)   lactated ringers       LOS: 3 days     Enzo Bi, MD [facility] If 7PM-7AM, please contact night-coverage 01/14/2020, 2:25 PM

## 2020-01-15 ENCOUNTER — Inpatient Hospital Stay: Payer: Medicare PPO

## 2020-01-15 DIAGNOSIS — M25511 Pain in right shoulder: Secondary | ICD-10-CM

## 2020-01-15 LAB — SARS CORONAVIRUS 2 (TAT 6-24 HRS): SARS Coronavirus 2: NEGATIVE

## 2020-01-15 MED ORDER — METHYLPREDNISOLONE 4 MG PO TBPK: 4.0000 mg | ORAL_TABLET | ORAL | Status: DC

## 2020-01-15 MED ORDER — METHYLPREDNISOLONE 4 MG PO TBPK
8.0000 mg | ORAL_TABLET | Freq: Every morning | ORAL | Status: DC
  Filled 2020-01-15: qty 21

## 2020-01-15 MED ORDER — METHYLPREDNISOLONE 4 MG PO TBPK: 8.0000 mg | ORAL_TABLET | Freq: Every evening | ORAL | Status: DC

## 2020-01-15 MED ORDER — SENNA 8.6 MG PO TABS: 1.0000 | ORAL_TABLET | Freq: Once | ORAL | Status: DC

## 2020-01-15 MED ORDER — TRAMADOL HCL 50 MG PO TABS: 50.0000 mg | ORAL_TABLET | Freq: Three times a day (TID) | ORAL | 0 refills | Status: AC | PRN

## 2020-01-15 MED ORDER — METHYLPREDNISOLONE 4 MG PO TBPK: 4.0000 mg | ORAL_TABLET | Freq: Three times a day (TID) | ORAL | Status: DC

## 2020-01-15 MED ORDER — METHYLPREDNISOLONE 4 MG PO TBPK: 4.0000 mg | ORAL_TABLET | Freq: Four times a day (QID) | ORAL | Status: DC

## 2020-01-15 MED ORDER — PREDNISONE 50 MG PO TABS: 50.0000 mg | ORAL_TABLET | Freq: Every day | ORAL | Status: DC

## 2020-01-15 MED ORDER — PREDNISONE 10 MG PO TABS: ORAL_TABLET | ORAL | 0 refills | Status: AC

## 2020-01-15 MED ORDER — TRAMADOL HCL 50 MG PO TABS: 50.0000 mg | ORAL_TABLET | Freq: Three times a day (TID) | ORAL | Status: DC | PRN

## 2020-01-15 MED ORDER — POLYETHYLENE GLYCOL 3350 17 G PO PACK: 17.0000 g | PACK | Freq: Once | ORAL | 0 refills | Status: AC

## 2020-01-15 MED ORDER — POLYETHYLENE GLYCOL 3350 17 G PO PACK: 17.0000 g | PACK | Freq: Once | ORAL | Status: DC

## 2020-01-15 MED ORDER — LEVOFLOXACIN 500 MG PO TABS: 500.0000 mg | ORAL_TABLET | Freq: Every day | ORAL | 0 refills | Status: AC

## 2020-01-15 NOTE — Plan of Care (Signed)
  Problem: Education: Goal: Knowledge of General Education information will improve Description: Including pain rating scale, medication(s)/side effects and non-pharmacologic comfort measures Outcome: Adequate for Discharge   Problem: Health Behavior/Discharge Planning: Goal: Ability to manage health-related needs will improve Outcome: Adequate for Discharge   Problem: Clinical Measurements: Goal: Ability to maintain clinical measurements within normal limits will improve Outcome: Adequate for Discharge Goal: Will remain free from infection Outcome: Adequate for Discharge Goal: Diagnostic test results will improve Outcome: Adequate for Discharge Goal: Respiratory complications will improve Outcome: Adequate for Discharge Goal: Cardiovascular complication will be avoided Outcome: Adequate for Discharge   Problem: Clinical Measurements: Goal: Will remain free from infection Outcome: Adequate for Discharge

## 2020-01-15 NOTE — TOC Progression Note (Signed)
Transition of Care [facility]) - Progression Note    Patient Details  Name: Danny Bowman MRN: [id number] Date of Birth: 1951-11-28  Transition of Care [facility]) CM/SW Contact  Trenton Founds, RN Phone Number: 01/15/2020, 9:44 AM  Clinical Narrative:   RNCM received authorization from [facility] health. Berkley Harvey EH#[id number] with beginning date of 1/27 for 3 days. Next review due on 1/29 and CM is [place]. Fax number is [phone].     Expected Discharge Plan: Skilled Nursing Facility Barriers to Discharge: No Barriers Identified  Expected Discharge Plan and Services Expected Discharge Plan: Skilled Nursing Facility   Discharge Planning Services: CM Consult Post Acute Care Choice: Skilled Nursing Facility Living arrangements for the past 2 months: Single Family Home                                       Social Determinants of Health (SDOH) Interventions    Readmission Risk Interventions No flowsheet data found.

## 2020-01-15 NOTE — Progress Notes (Signed)
Report given to Collene Schlichter at [facility]. EMS called to transport patient.

## 2020-01-15 NOTE — Discharge Instructions (Signed)
Shoulder sling right

## 2020-01-15 NOTE — TOC Transition Note (Signed)
Transition of Care [facility]) - CM/SW Discharge Note   Patient Details  Name: SAMIN MILKE MRN: [id number] Date of Birth: November 25, 1951  Transition of Care [facility]) CM/SW Contact:  Trenton Founds, RN Phone Number: 01/15/2020, 4:06 PM   Clinical Narrative:   RNCM completed EMS paperwork, patient ready for discharge. RNCM signing off.       Barriers to Discharge: No Barriers Identified   Patient Goals and CMS Choice Patient states their goals for this hospitalization and ongoing recovery are:: to get my knee straightened out. CMS Medicare.gov Compare Post Acute Care list provided to:: Patient Choice offered to / list presented to : Patient  Discharge Placement                       Discharge Plan and Services   Discharge Planning Services: CM Consult Post Acute Care Choice: Skilled Nursing Facility                               Social Determinants of Health (SDOH) Interventions     Readmission Risk Interventions No flowsheet data found.

## 2020-01-15 NOTE — Discharge Summary (Signed)
[facility] [place] NAME: Danny Bowman    MR#:  [id number]  DATE OF BIRTH:  1951-06-14  DATE OF ADMISSION:  01/11/2020 ADMITTING PHYSICIAN: Athena Masse, MD  DATE OF DISCHARGE: 01/15/2020  PRIMARY CARE PHYSICIAN: [place] [place] [facility]    ADMISSION DIAGNOSIS:  Cough [R05] AKI (acute kidney injury) ([place]) [N17.9] Sepsis ([place]) [A41.9] Acute pain of right shoulder [M25.511] Community acquired pneumonia, unspecified laterality [J18.9]  DISCHARGE DIAGNOSIS:  Sepsis due to Pneumonia Acute renal failure/ATN setting of sepsis improving right shoulder rotator cuff tear SECONDARY DIAGNOSIS:   Past Medical History:  Diagnosis Date  . Osteoarthritis, knee   . Sciatica     HOSPITAL COURSE:  Danny Bowman a 69 y.o.Caucasian malewithno significant past medical history who presented initially to the emergency room by EMS following a fall in front of an urgent care center.   Sepsis ([place]) 2/2 on admission--resolved CAP (community acquired pneumonia) -Patient met sepsis criteria with tachycardia, white cell count of 30,000, pneumonia on chest x-ray,acute kidney injury. Procal also very elevated at 41. No hypoxia. - Pt was onIV Rocephin and azithromycin--now on Levaquin  to finish a 7 day course. -Wbc down to 15 K, afebrile  AKI (acute kidney injury) (HCC)/ATN in the setting of sepsis improved Secondary to sepsis as above --Cr 4.23 on presentation. No recent baseline. S/p IV hydration. MIVF d/c'ed on 1/25 after pt start having good oral intake and hydration. -creat down to 2.5  Elevated troponin -Suspect secondary to demand ischemia from sepsis as patient has no chest pain -Troponintrending down, 227>>214 -EKG no ACS-related changes.  Right shoulder pain due to severe rotator cuff tear -Calcific tendinosis seen on x-ray but no acute injury -patient has severe restricted range of motion.  -MRI of the right  shoulder showed acue tear and DJD -Consulted Dr. Harlow Mares orthopedic-- recommends prednisone taper, shoulder sling/immobilizer and follow-up as outpatient  Elevated LFTs, mild--resolved Possibly related to sepsis  Generalized weakness and multiple falls Patient has no focal deficits Physical therapy eval, rec SNF rehab  Left knee pain --Likely swollen from the fall --xray of left kneeshowed large joint effusion and degenerative joint disease, but no fracture or misalignment. dr Billie Ruddy Curb-side consulted ortho today who agreed that in general, joint effusion from trauma does not need to be aspirated, since it will return and also the procedure itself risks infection. --tylenol 1g TID scheduled --applycold compress  Right arm swelling, new --US DVT studyneg for DVT. -likely dependent swelling due to  Inability to move right shoulder   Patient will discharged to rehab with outpatient follow-up primary care physician and orthopedic Dr. Harlow Mares  CONSULTS OBTAINED:  Treatment Team:  Lovell Sheehan, MD  DRUG ALLERGIES:  No Known Allergies  DISCHARGE MEDICATIONS:   Allergies as of 01/15/2020   No Known Allergies     Medication List    STOP taking these medications   naproxen sodium 220 MG tablet Commonly known as: ALEVE     TAKE these medications   levofloxacin 500 MG tablet Commonly known as: LEVAQUIN Take 1 tablet (500 mg total) by mouth daily for 4 days. Start taking on: January 16, 2020   polyethylene glycol 17 g packet Commonly known as: MIRALAX / GLYCOLAX Take 17 g by mouth once for 1 dose.   predniSONE 10 MG tablet Commonly known as: DELTASONE Take 50 mg daily taper by 10 mg daily then stop Start taking on: January 16, 2020   traMADol  50 MG tablet Commonly known as: ULTRAM Take 1 tablet (50 mg total) by mouth every 8 (eight) hours as needed for moderate pain or severe pain.       If you experience worsening of your admission symptoms, develop  shortness of breath, life threatening emergency, suicidal or homicidal thoughts you must seek medical attention immediately by calling 911 or calling your MD immediately  if symptoms less severe.  You Must read complete instructions/literature along with all the possible adverse reactions/side effects for all the Medicines you take and that have been prescribed to you. Take any new Medicines after you have completely understood and accept all the possible adverse reactions/side effects.   Please note  You were cared for by a hospitalist during your hospital stay. If you have any questions about your discharge medications or the care you received while you were in the hospital after you are discharged, you can call the unit and asked to speak with the hospitalist on call if the hospitalist that took care of you is not available. Once you are discharged, your primary care physician will handle any further medical issues. Please note that NO REFILLS for any discharge medications will be authorized once you are discharged, as it is imperative that you return to your primary care physician (or establish a relationship with a primary care physician if you do not have one) for your aftercare needs so that they can reassess your need for medications and monitor your lab values.    DATA REVIEW:   CBC  Recent Labs  Lab 01/14/20 0408  WBC 15.1*  HGB 14.0  HCT 42.0  PLT 225    Chemistries  Recent Labs  Lab 01/12/20 0306 01/13/20 0441 01/14/20 0408  NA 134*   < > 137  K 3.9   < > 4.3  CL 100   < > 104  CO2 24   < > 25  GLUCOSE 90   < > 79  BUN 92*   < > 68*  CREATININE 3.67*   < > 2.52*  CALCIUM 9.6   < > 9.5  MG  --    < > 1.9  AST 20  --   --   ALT 43  --   --   ALKPHOS 116  --   --   BILITOT 1.0  --   --    < > = values in this interval not displayed.    Microbiology Results   Recent Results (from the past 240 hour(s))  Blood Culture (routine x 2)     Status: None (Preliminary  result)   Collection Time: 01/11/20  7:48 PM   Specimen: BLOOD  Result Value Ref Range Status   Specimen Description BLOOD LEFT ANTECUBITAL  Final   Special Requests   Final    BOTTLES DRAWN AEROBIC AND ANAEROBIC Blood Culture adequate volume   Culture   Final    NO GROWTH 4 DAYS Performed at [facility], [address], [place],  [address]    Report Status PENDING  Incomplete  Blood Culture (routine x 2)     Status: None (Preliminary result)   Collection Time: 01/11/20  7:48 PM   Specimen: BLOOD LEFT HAND  Result Value Ref Range Status   Specimen Description BLOOD LEFT HAND  Final   Special Requests   Final    BOTTLES DRAWN AEROBIC AND ANAEROBIC Blood Culture adequate volume   Culture   Final    NO GROWTH  4 DAYS Performed at [facility], [address] [place], [place] [address]    Report Status PENDING  Incomplete  SARS CORONAVIRUS 2 (TAT 6-24 HRS) Nasopharyngeal Nasopharyngeal Swab     Status: None   Collection Time: 01/11/20 11:02 PM   Specimen: Nasopharyngeal Swab  Result Value Ref Range Status   SARS Coronavirus 2 NEGATIVE NEGATIVE Final    Comment: (NOTE) SARS-CoV-2 target nucleic acids are NOT DETECTED. The SARS-CoV-2 RNA is generally detectable in upper and lower respiratory specimens during the acute phase of infection. Negative results do not preclude SARS-CoV-2 infection, do not rule out co-infections with other pathogens, and should not be used as the sole basis for treatment or other patient management decisions. Negative results must be combined with clinical observations, patient history, and epidemiological information. The expected result is Negative. Fact Sheet for Patients: SugarRoll.be Fact Sheet for Healthcare Providers: https://www.woods-mathews.com/ This test is not yet approved or cleared by the [place] FDA and  has been authorized for detection and/or diagnosis of  SARS-CoV-2 by FDA under an Emergency Use Authorization (EUA). This EUA will remain  in effect (meaning this test can be used) for the duration of the COVID-19 declaration under Section 56 4(b)(1) of the Act, 21 U.S.C. section 360bbb-3(b)(1), unless the authorization is terminated or revoked sooner. Performed at [facility], [place] [address], [place], [place] [address]   Urine culture     Status: Abnormal   Collection Time: 01/12/20 12:55 AM   Specimen: Urine, Random  Result Value Ref Range Status   Specimen Description   Final    URINE, RANDOM Performed at [facility], [address] [place], [place] [address]    Special Requests   Final    NONE Performed at [facility], [address] [place], [place] [address]    Culture MULTIPLE SPECIES PRESENT, SUGGEST RECOLLECTION (A)  Final   Report Status 01/13/2020 FINAL  Final  SARS CORONAVIRUS 2 (TAT 6-24 HRS) Nasopharyngeal Nasopharyngeal Swab     Status: None   Collection Time: 01/14/20  9:56 PM   Specimen: Nasopharyngeal Swab  Result Value Ref Range Status   SARS Coronavirus 2 NEGATIVE NEGATIVE Final    Comment: (NOTE) SARS-CoV-2 target nucleic acids are NOT DETECTED. The SARS-CoV-2 RNA is generally detectable in upper and lower respiratory specimens during the acute phase of infection. Negative results do not preclude SARS-CoV-2 infection, do not rule out co-infections with other pathogens, and should not be used as the sole basis for treatment or other patient management decisions. Negative results must be combined with clinical observations, patient history, and epidemiological information. The expected result is Negative. Fact Sheet for Patients: SugarRoll.be Fact Sheet for Healthcare Providers: https://www.woods-mathews.com/ This test is not yet approved or cleared by the [place] FDA and  has been authorized for detection and/or diagnosis of  SARS-CoV-2 by FDA under an Emergency Use Authorization (EUA). This EUA will remain  in effect (meaning this test can be used) for the duration of the COVID-19 declaration under Section 56 4(b)(1) of the Act, 21 U.S.C. section 360bbb-3(b)(1), unless the authorization is terminated or revoked sooner. Performed at [facility], [place] [address], [place], [place] [address]     RADIOLOGY:  MR SHOULDER RIGHT WO CONTRAST  Result Date: 01/15/2020 CLINICAL DATA:  Right shoulder pain and weakness. The patient suffered a fall 01/11/2020. Initial encounter. EXAM: MRI OF THE RIGHT SHOULDER WITHOUT CONTRAST TECHNIQUE: Multiplanar, multisequence MR imaging of the shoulder  was performed. No intravenous contrast was administered. COMPARISON:  Plain films right shoulder 01/11/2020. FINDINGS: Rotator cuff: The patient has complete supraspinatus and infraspinatus tendon tears with retraction of 2.5-3.5 cm. There is severe subscapularis tendinopathy but the tendon appears intact. Muscles: Extensive edema in the rotator cuff muscles is worst in the supraspinatus and infraspinatus. A debris containing septated cyst posterior and superior to the musculotendinous junction of the supraspinatus measures 2.4 cm transverse by 1.7 cm AP x 1.8 cm craniocaudal. Biceps long head: Intact. Mild appearing tendinopathy of the intra-articular segment noted. Acromioclavicular Joint: Moderate to moderately severe osteoarthritis. Type 1. acromion. There is a large volume of complex fluid in the subacromial/subdeltoid bursa. Glenohumeral Joint: Degenerative change is seen with near complete denuding of hyaline cartilage, a few small subchondral cysts in the glenoid and a small osteophyte off the humeral head. There is a glenohumeral joint effusion containing debris. Labrum:  The superior and posterior labrum are markedly degenerated. Bones:  No fracture, contusion or worrisome lesion. Other: None. IMPRESSION: Complete supraspinatus and  infraspinatus tendon tears with 2.5-3.5 cm of retraction. There is extensive edema in both muscle bellies suggestive of acute tear. Septated cyst posterior and superior to the musculotendinous junction of the supraspinatus is likely a ganglion. Severe subscapularis tendinopathy without tear. Advanced glenohumeral osteoarthritis. There is a large glenohumeral joint effusion containing debris. Subacromial/subdeltoid bursitis is likely hemorrhagic. Moderate to moderately severe acromioclavicular osteoarthritis. Electronically Signed   By: Inge Rise M.D.   On: 01/15/2020 11:00   US Venous Img Upper Uni Right(DVT)  Result Date: 01/13/2020 CLINICAL DATA:  69 year old male with a history swelling EXAM: RIGHT UPPER EXTREMITY VENOUS DOPPLER ULTRASOUND TECHNIQUE: Gray-scale sonography with graded compression, as well as color Doppler and duplex ultrasound were performed to evaluate the upper extremity deep venous system from the level of the subclavian vein and including the jugular, axillary, basilic, radial, ulnar and upper cephalic vein. Spectral Doppler was utilized to evaluate flow at rest and with distal augmentation maneuvers. COMPARISON:  None. FINDINGS: Contralateral Subclavian Vein: Respiratory phasicity is normal and symmetric with the symptomatic side. No evidence of thrombus. Normal compressibility. Internal Jugular Vein: No evidence of thrombus. Normal compressibility, respiratory phasicity and response to augmentation. Subclavian Vein: No evidence of thrombus. Normal compressibility, respiratory phasicity and response to augmentation. Axillary Vein: No evidence of thrombus. Normal compressibility, respiratory phasicity and response to augmentation. Cephalic Vein: No evidence of thrombus. Normal compressibility, respiratory phasicity and response to augmentation. Basilic Vein: No evidence of thrombus. Normal compressibility, respiratory phasicity and response to augmentation. Brachial Veins: No  evidence of thrombus. Normal compressibility, respiratory phasicity and response to augmentation. Radial Veins: No evidence of thrombus. Normal compressibility, respiratory phasicity and response to augmentation. Ulnar Veins: No evidence of thrombus. Normal compressibility, respiratory phasicity and response to augmentation. Other Findings:  None visualized. IMPRESSION: Sonographic survey of the right upper extremity negative for DVT Electronically Signed   By: Corrie Mckusick D.O.   On: 01/13/2020 14:13     CODE STATUS:     Code Status Orders  (From admission, onward)         Start     Ordered   01/11/20 2117  Full code  Continuous     01/11/20 2117        Code Status History    This patient has a current code status but no historical code status.   Advance Care Planning Activity       TOTAL TIME TAKING CARE OF THIS PATIENT: *72*  minutes.    Fritzi Mandes M.D  [facility]    CC: Primary care physician; [place] [place] [facility]

## 2020-01-15 NOTE — Progress Notes (Signed)
[facility] [place] NAME: Danny Bowman    MR#:  [id number]  DATE OF BIRTH:  1951/03/17  SUBJECTIVE:   Patient complains of significant right shoulder pain. Started few weeks ago got worse for last one week where he is barely able to move his shoulder. Patient tolerating PO well. Left knee abrasion after fall with decreased range of motion. No fever cough REVIEW OF SYSTEMS:   Review of Systems  Constitutional: Negative for chills, fever and weight loss.  HENT: Negative for ear discharge, ear pain and nosebleeds.   Eyes: Negative for blurred vision, pain and discharge.  Respiratory: Negative for sputum production, shortness of breath, wheezing and stridor.   Cardiovascular: Negative for chest pain, palpitations, orthopnea and PND.  Gastrointestinal: Negative for abdominal pain, diarrhea, nausea and vomiting.  Genitourinary: Negative for frequency and urgency.  Musculoskeletal: Positive for joint pain. Negative for back pain.       Right shoulder pain left knee swelling with pain  Neurological: Negative for sensory change, speech change, focal weakness and weakness.  Psychiatric/Behavioral: Negative for depression and hallucinations. The patient is not nervous/anxious.    Tolerating Diet:yes Tolerating PT: Rehab  DRUG ALLERGIES:  No Known Allergies  VITALS:  Blood pressure 127/72, pulse 93, temperature 98.2 F (36.8 C), temperature source Oral, resp. rate 20, height _0  (1.93 m), weight 136.1 kg, SpO2 96 %.  PHYSICAL EXAMINATION:   Physical Exam  GENERAL:  69 y.o.-year-old patient lying in the bed with no acute distress.  EYES: Pupils equal, round, reactive to light and accommodation. No scleral icterus.   HEENT: Head atraumatic, normocephalic. Oropharynx and nasopharynx clear.  NECK:  Supple, no jugular venous distention. No thyroid enlargement, no tenderness.  LUNGS: Normal breath sounds bilaterally, no wheezing, rales,  rhonchi. No use of accessory muscles of respiration.  CARDIOVASCULAR: S1, S2 normal. No murmurs, rubs, or gallops.  ABDOMEN: Soft, nontender, nondistended. Bowel sounds present. No organomegaly or mass.  EXTREMITIES: No cyanosis, clubbing or edema b/l.   Restricted range of motion at right shoulder left knee joint swelling with abrasion and minimal restricted flexion NEUROLOGIC: Cranial nerves II through XII are intact. No focal Motor or sensory deficits b/l.   PSYCHIATRIC:  patient is alert and oriented x 3.  SKIN: No obvious rash, lesion, or ulcer.   LABORATORY PANEL:  CBC Recent Labs  Lab 01/14/20 0408  WBC 15.1*  HGB 14.0  HCT 42.0  PLT 225    Chemistries  Recent Labs  Lab 01/12/20 0306 01/13/20 0441 01/14/20 0408  NA 134*   < > 137  K 3.9   < > 4.3  CL 100   < > 104  CO2 24   < > 25  GLUCOSE 90   < > 79  BUN 92*   < > 68*  CREATININE 3.67*   < > 2.52*  CALCIUM 9.6   < > 9.5  MG  --    < > 1.9  AST 20  --   --   ALT 43  --   --   ALKPHOS 116  --   --   BILITOT 1.0  --   --    < > = values in this interval not displayed.   Cardiac Enzymes No results for input(s): TROPONINI in the last 168 hours. RADIOLOGY:  DG Knee 1-2 Views Left  Result Date: 01/13/2020 CLINICAL DATA:  Left knee pain EXAM: LEFT KNEE - 1-2 VIEW COMPARISON:  None. FINDINGS: No evidence for an acute fracture or dislocation. Loss of joint space noted lateral compartment. Meniscal calcification noted medial compartment. Hypertrophic spurring visible in all 3 compartments in there is a large joint effusion in the suprapatellar bursa. 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 Electronically Signed   By: Misty Stanley M.D.   On: 01/13/2020 12:57   US Venous Img Upper Uni Right(DVT)  Result Date: 01/13/2020 CLINICAL DATA:  69 year old male with a history swelling EXAM: RIGHT UPPER EXTREMITY VENOUS DOPPLER ULTRASOUND TECHNIQUE: Gray-scale sonography with graded  compression, as well as color Doppler and duplex ultrasound were performed to evaluate the upper extremity deep venous system from the level of the subclavian vein and including the jugular, axillary, basilic, radial, ulnar and upper cephalic vein. Spectral Doppler was utilized to evaluate flow at rest and with distal augmentation maneuvers. COMPARISON:  None. FINDINGS: Contralateral Subclavian Vein: Respiratory phasicity is normal and symmetric with the symptomatic side. No evidence of thrombus. Normal compressibility. Internal Jugular Vein: No evidence of thrombus. Normal compressibility, respiratory phasicity and response to augmentation. Subclavian Vein: No evidence of thrombus. Normal compressibility, respiratory phasicity and response to augmentation. Axillary Vein: No evidence of thrombus. Normal compressibility, respiratory phasicity and response to augmentation. Cephalic Vein: No evidence of thrombus. Normal compressibility, respiratory phasicity and response to augmentation. Basilic Vein: No evidence of thrombus. Normal compressibility, respiratory phasicity and response to augmentation. Brachial Veins: No evidence of thrombus. Normal compressibility, respiratory phasicity and response to augmentation. Radial Veins: No evidence of thrombus. Normal compressibility, respiratory phasicity and response to augmentation. Ulnar Veins: No evidence of thrombus. Normal compressibility, respiratory phasicity and response to augmentation. Other Findings:  None visualized. IMPRESSION: Sonographic survey of the right upper extremity negative for DVT Electronically Signed   By: Corrie Mckusick D.O.   On: 01/13/2020 14:13   ASSESSMENT AND PLAN:   Danny Bowman a 69 y.o.Caucasian malewithno significant past medical history who presented initially to the emergency room by EMS following a fall in front of an urgent care center.    Sepsis ([facility]) 2/2 CAP (community acquired pneumonia) -Patient met sepsis criteria  with tachycardia, white cell count of 30,000, pneumonia on chest x-ray,acute kidney injury.  Procal also very elevated at 41.  No hypoxia. -  Pt was on IV Rocephin and azithromycin--now on Levaquin  to finish a 7 day course. -Wbc down to 15 K, afebrile  AKI (acute kidney injury) (HCC)/ATNin the setting of sepsis improved Secondary to sepsis as above --Cr 4.23 on presentation.  No recent baseline.  S/p IV hydration.  MIVF d/c'ed on 1/25 after pt start having good oral intake and hydration. -creat down to 2.5  Elevated troponin -Suspect secondary to demand ischemia from sepsis as patient has no chest pain -Troponintrending down, 227>>214 -EKG no ACS-related changes.  Right shoulder pain ?frozen shoulder -Calcific tendinosis seen on x-ray but no acute injury -patient has severe restricted range of motion. Up getting MRI of the right shoulder. -Consulted Dr. Harlow Mares orthopedic to see patient  Elevated LFTs, mild--resolved Possibly related to sepsis  Generalized weakness and multiple falls Patient has no focal deficits Physical therapy eval, rec SNF rehab  Left knee pain --Likely swollen from the fall --xray of left knee showed large joint effusion and degenerative joint disease, but no fracture or misalignment.  [facility] consulted ortho today who agreed that in general, joint effusion from trauma does not need to be aspirated, since it will return and also the procedure itself risks infection. --  tylenol 1g TID scheduled --apply cold compress  Right arm swelling, new --US DVT study neg for DVT.   -likely dependent swelling due to  Inability to move right shoulder   DVT prophylaxis: Lovenox SQ Code Status: Full code  Family Communication: pt Disposition Plan: PT rec SNF.   Barrier to discharge: pending MRI shoulder/ortho consult/Insurance auth  TOTAL TIME TAKING CARE OF THIS PATIENT: *30* minutes.  >50% time spent on counselling and coordination of care  Note: This  dictation was prepared with Dragon dictation along with smaller phrase technology. Any transcriptional errors that result from this process are unintentional.  Fritzi Mandes M.D    [facility]   CC: Primary care physician; [place], [place] Primary CarePatient ID: Danny Bowman, male   DOB: 1951-07-22, 69 y.o.   MRN: [id number]

## 2020-01-15 NOTE — Consult Note (Signed)
ORTHOPAEDIC CONSULTATION  REQUESTING PHYSICIAN: Fritzi Mandes, MD  Chief Complaint: right shoulder pain  HPI: Danny Bowman is a 69 y.o. male who complains of right shoulder pain. Please see H&P and ED notes for details. Denies any numbness, tingling or constitutional symptoms.  Past Medical History:  Diagnosis Date  . Osteoarthritis, knee   . Sciatica    Past Surgical History:  Procedure Laterality Date  . KNEE SURGERY     Social History   Socioeconomic History  . Marital status: Widowed    Spouse name: Not on file  . Number of children: Not on file  . Years of education: Not on file  . Highest education level: Not on file  Occupational History  . Not on file  Tobacco Use  . Smoking status: Never Smoker  . Smokeless tobacco: Never Used  Substance and Sexual Activity  . Alcohol use: Never  . Drug use: Never  . Sexual activity: Not on file  Other Topics Concern  . Not on file  Social History Narrative  . Not on file   Social Determinants of Health   Financial Resource Strain:   . Difficulty of Paying Living Expenses: Not on file  Food Insecurity:   . Worried About Charity fundraiser in the Last Year: Not on file  . Ran Out of Food in the Last Year: Not on file  Transportation Needs:   . Lack of Transportation (Medical): Not on file  . Lack of Transportation (Non-Medical): Not on file  Physical Activity:   . Days of Exercise per Week: Not on file  . Minutes of Exercise per Session: Not on file  Stress:   . Feeling of Stress : Not on file  Social Connections:   . Frequency of Communication with Friends and Family: Not on file  . Frequency of Social Gatherings with Friends and Family: Not on file  . Attends Religious Services: Not on file  . Active Member of Clubs or Organizations: Not on file  . Attends Archivist Meetings: Not on file  . Marital Status: Not on file   History reviewed. No pertinent family history. No Known Allergies Prior to  Admission medications   Medication Sig Start Date End Date Taking? Authorizing Provider  naproxen sodium (ALEVE) 220 MG tablet Take 220 mg by mouth 2 (two) times daily as needed.   Yes [provider]   MR SHOULDER RIGHT WO CONTRAST  Result Date: 01/15/2020 CLINICAL DATA:  Right shoulder pain and weakness. The patient suffered a fall 01/11/2020. Initial encounter. EXAM: MRI OF THE RIGHT SHOULDER WITHOUT CONTRAST TECHNIQUE: Multiplanar, multisequence MR imaging of the shoulder was performed. No intravenous contrast was administered. COMPARISON:  Plain films right shoulder 01/11/2020. FINDINGS: Rotator cuff: The patient has complete supraspinatus and infraspinatus tendon tears with retraction of 2.5-3.5 cm. There is severe subscapularis tendinopathy but the tendon appears intact. Muscles: Extensive edema in the rotator cuff muscles is worst in the supraspinatus and infraspinatus. A debris containing septated cyst posterior and superior to the musculotendinous junction of the supraspinatus measures 2.4 cm transverse by 1.7 cm AP x 1.8 cm craniocaudal. Biceps long head: Intact. Mild appearing tendinopathy of the intra-articular segment noted. Acromioclavicular Joint: Moderate to moderately severe osteoarthritis. Type 1. acromion. There is a large volume of complex fluid in the subacromial/subdeltoid bursa. Glenohumeral Joint: Degenerative change is seen with near complete denuding of hyaline cartilage, a few small subchondral cysts in the glenoid and a small osteophyte off the humeral  head. There is a glenohumeral joint effusion containing debris. Labrum:  The superior and posterior labrum are markedly degenerated. Bones:  No fracture, contusion or worrisome lesion. Other: None. IMPRESSION: Complete supraspinatus and infraspinatus tendon tears with 2.5-3.5 cm of retraction. There is extensive edema in both muscle bellies suggestive of acute tear. Septated cyst posterior and superior to the  musculotendinous junction of the supraspinatus is likely a ganglion. Severe subscapularis tendinopathy without tear. Advanced glenohumeral osteoarthritis. There is a large glenohumeral joint effusion containing debris. Subacromial/subdeltoid bursitis is likely hemorrhagic. Moderate to moderately severe acromioclavicular osteoarthritis. Electronically Signed   By: Drusilla Kanner M.D.   On: 01/15/2020 11:00   US Venous Img Upper Uni Right(DVT)  Result Date: 01/13/2020 CLINICAL DATA:  69 year old male with a history swelling EXAM: RIGHT UPPER EXTREMITY VENOUS DOPPLER ULTRASOUND TECHNIQUE: Gray-scale sonography with graded compression, as well as color Doppler and duplex ultrasound were performed to evaluate the upper extremity deep venous system from the level of the subclavian vein and including the jugular, axillary, basilic, radial, ulnar and upper cephalic vein. Spectral Doppler was utilized to evaluate flow at rest and with distal augmentation maneuvers. COMPARISON:  None. FINDINGS: Contralateral Subclavian Vein: Respiratory phasicity is normal and symmetric with the symptomatic side. No evidence of thrombus. Normal compressibility. Internal Jugular Vein: No evidence of thrombus. Normal compressibility, respiratory phasicity and response to augmentation. Subclavian Vein: No evidence of thrombus. Normal compressibility, respiratory phasicity and response to augmentation. Axillary Vein: No evidence of thrombus. Normal compressibility, respiratory phasicity and response to augmentation. Cephalic Vein: No evidence of thrombus. Normal compressibility, respiratory phasicity and response to augmentation. Basilic Vein: No evidence of thrombus. Normal compressibility, respiratory phasicity and response to augmentation. Brachial Veins: No evidence of thrombus. Normal compressibility, respiratory phasicity and response to augmentation. Radial Veins: No evidence of thrombus. Normal compressibility, respiratory phasicity  and response to augmentation. Ulnar Veins: No evidence of thrombus. Normal compressibility, respiratory phasicity and response to augmentation. Other Findings:  None visualized. IMPRESSION: Sonographic survey of the right upper extremity negative for DVT Electronically Signed   By: Gilmer Mor D.O.   On: 01/13/2020 14:13    Positive ROS: All other systems have been reviewed and were otherwise negative with the exception of those mentioned in the HPI and as above.  Physical Exam: General: Alert, no acute distress Cardiovascular: No pedal edema Respiratory: No cyanosis, no use of accessory musculature GI: No organomegaly, abdomen is soft and non-tender Skin: No lesions in the area of chief complaint Neurologic: Sensation intact distally Psychiatric: Patient is competent for consent with normal mood and affect Lymphatic: No axillary or cervical lymphadenopathy  MUSCULOSKELETAL: pain with FF, ABD. No erythema. Compartments soft. Good cap refill. Motor and sensory intact distally.  Assessment: Right shoulder cuff tear arthropathy  Plan: This appears to be an acute on chronic injury. Recommend sling for comfort. He may start PT/OT with gentle ROM and strengthening of the right upper extremity. He may see me in the office in 2 to 3 weeks. Please call with questions.    Lyndle Herrlich, MD    01/15/2020 1:39 PM

## 2020-01-16 LAB — CULTURE, BLOOD (ROUTINE X 2)
Culture: NO GROWTH
Culture: NO GROWTH
Special Requests: ADEQUATE
Special Requests: ADEQUATE

## 2020-02-17 DEATH — deceased

## 2021-09-02 IMAGING — MR MR SHOULDER*R* W/O CM
4 of 5 series · 31 of 40 positions shown · non-contrast
Comparison: Plain films right shoulder 01/11/2020.

CLINICAL DATA: Right shoulder pain and weakness. The patient
suffered a fall 01/11/2020. Initial encounter.

EXAM:
MRI OF THE RIGHT SHOULDER WITHOUT CONTRAST
TECHNIQUE: Multiplanar, multisequence MR imaging of the shoulder was performed.
No intravenous contrast was administered.

[Series 5: PD fat-sat · axial · right · 4.0mm · 0.55mm/px · z∈[-88,+37]mm · 8 of 27 slices shown (1 of 2)]
[im 1/27]
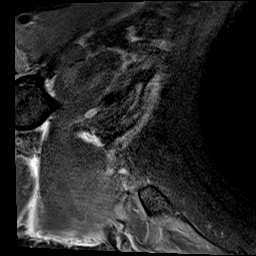
[im 4/27]
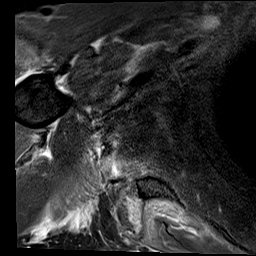
[im 8/27]
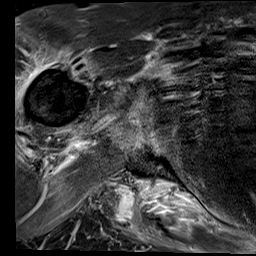
[im 12/27]
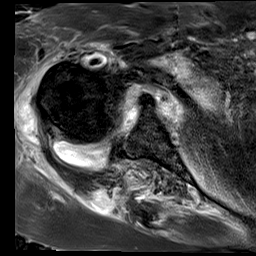
[im 15/27]
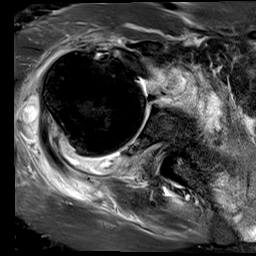
[im 19/27]
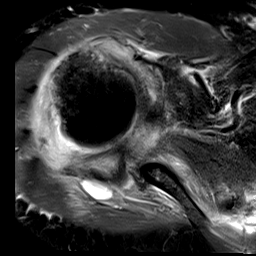
[im 23/27]
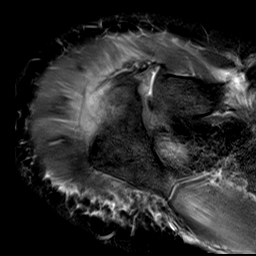
[im 27/27]
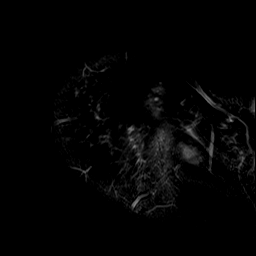

[Series 7: PD fat-sat · oblique · right · 4.0mm · 0.44mm/px · 8 of 26 slices shown (2 of 2)]
[im 1/26]
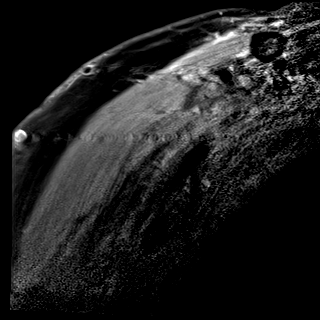
[im 4/26]
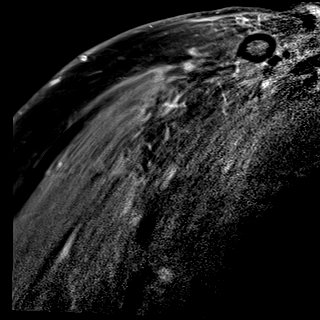
[im 8/26]
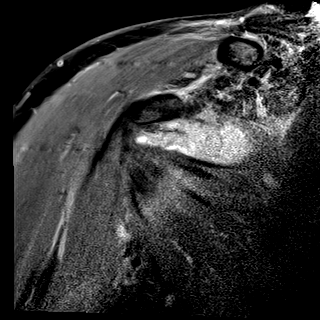
[im 11/26]
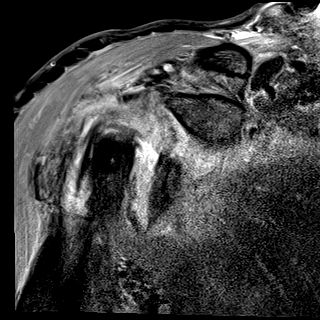
[im 15/26]
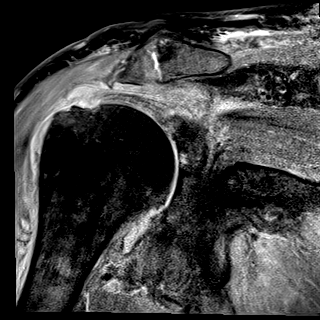
[im 18/26]
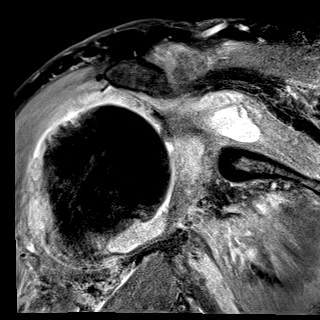
[im 22/26]
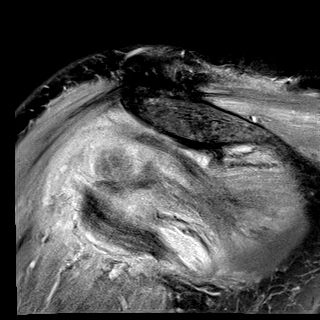
[im 26/26]
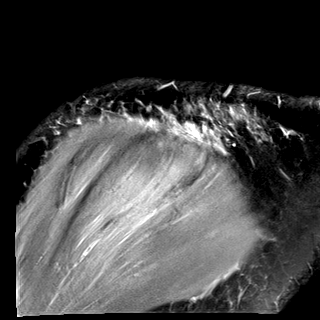

[Series 8: T2 fat-sat · oblique · right · 4.0mm · 0.44mm/px · 8 of 26 slices shown (1 of 2)]
[im 1/26]
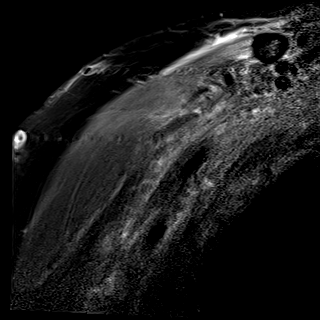
[im 4/26]
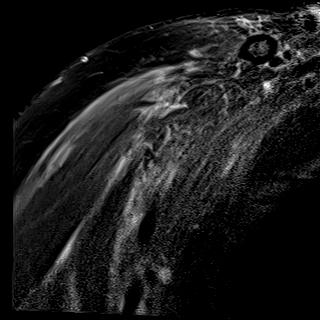
[im 8/26]
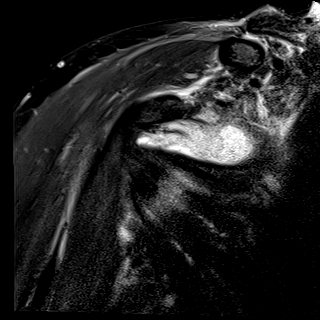
[im 11/26]
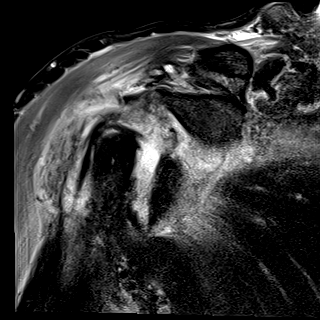
[im 15/26]
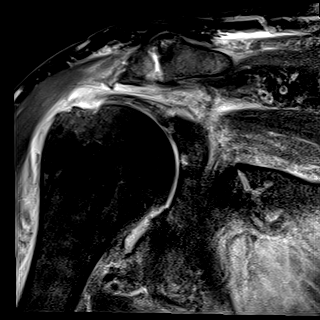
[im 18/26]
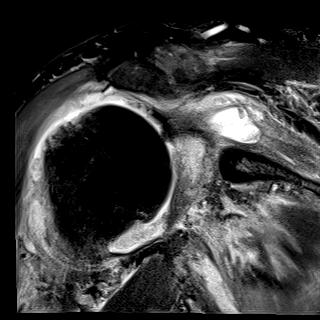
[im 22/26]
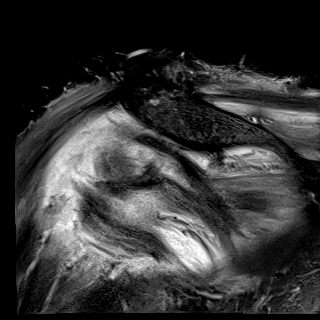
[im 26/26]
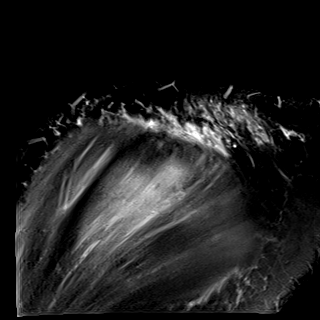

[Series 9: T2 fat-sat · oblique · right · 4.0mm · 0.23mm/px · 7 of 24 slices shown (2 of 2)]
[im 1/24]
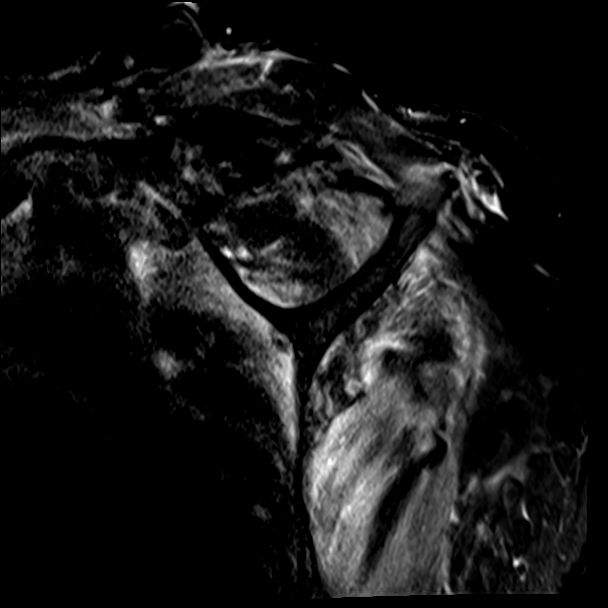
[im 4/24]
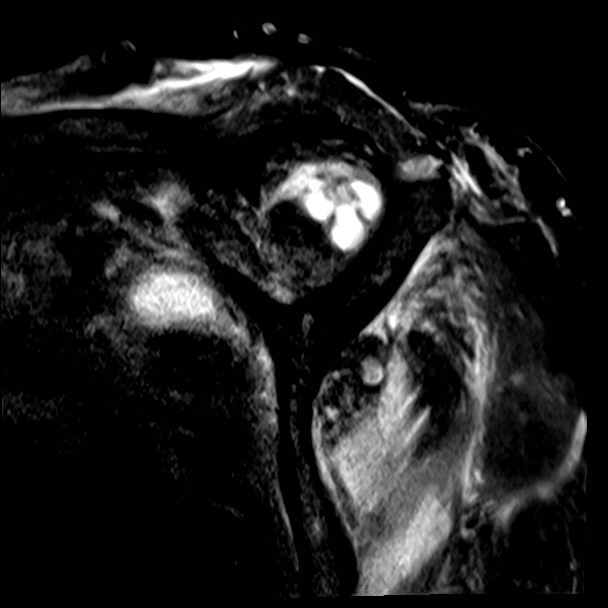
[im 7/24]
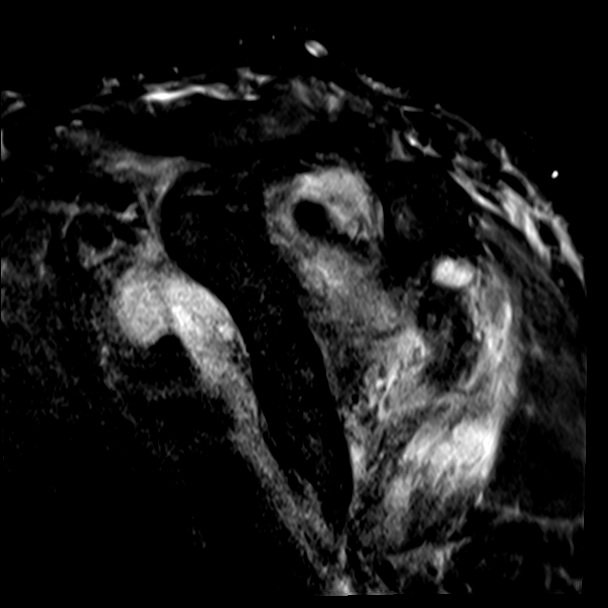
[im 10/24]
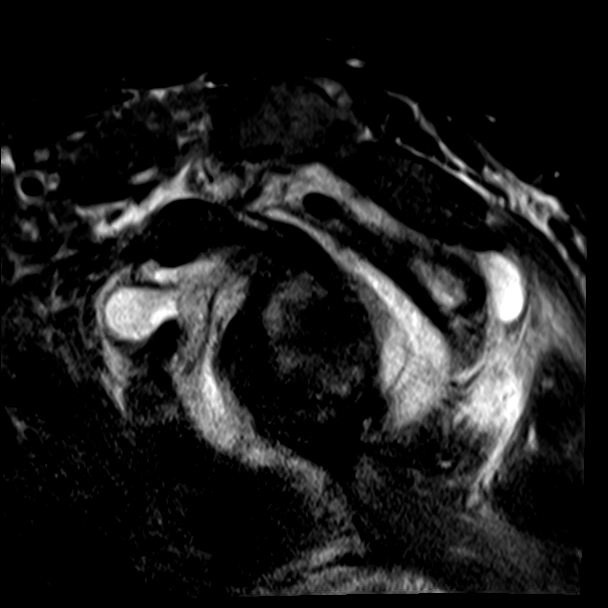
[im 14/24]
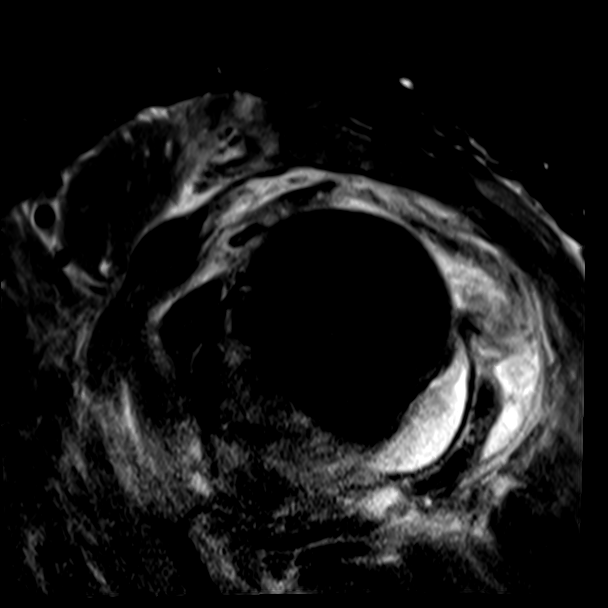
[im 17/24]
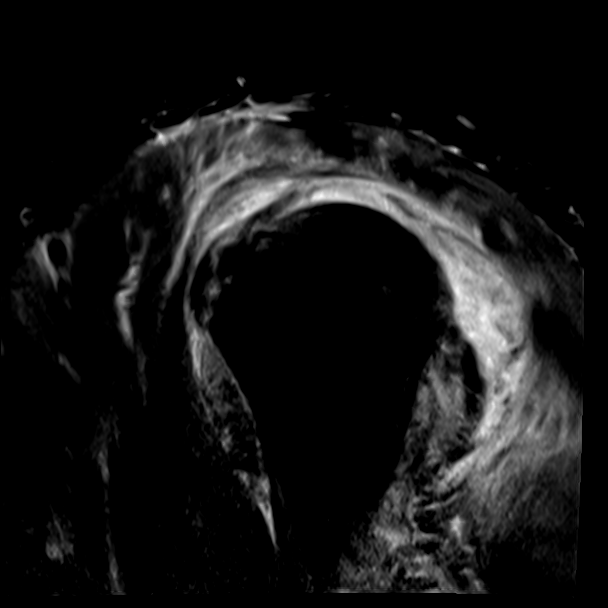
[im 20/24]
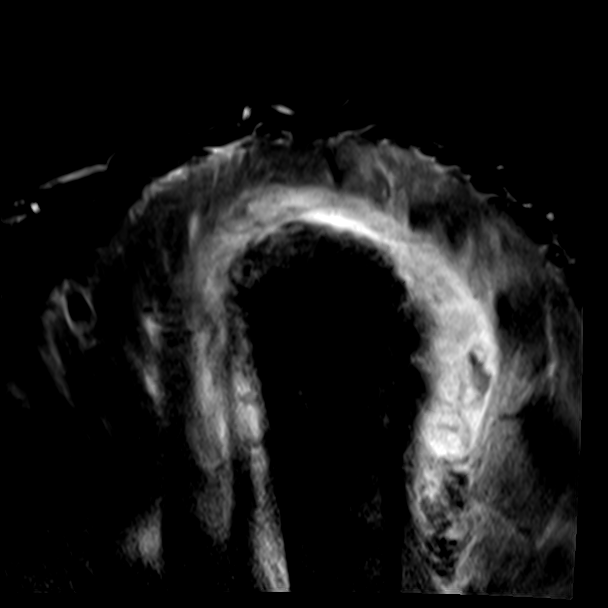

[31 of 40 positions shown; findings below may reference images not displayed]

FINDINGS: Rotator cuff: The patient has complete supraspinatus and
infraspinatus tendon tears with retraction of 2.5-3.5 cm. There is
severe subscapularis tendinopathy but the tendon appears intact.

Muscles: Extensive edema in the rotator cuff muscles is worst in the
supraspinatus and infraspinatus. A debris containing septated cyst
posterior and superior to the musculotendinous junction of the
supraspinatus measures 2.4 cm transverse by 1.7 cm AP x 1.8 cm
craniocaudal.

Biceps long head: Intact. Mild appearing tendinopathy of the
intra-articular segment noted.

Acromioclavicular Joint: Moderate to moderately severe
osteoarthritis. Type 1. acromion. There is a large volume of complex
fluid in the subacromial/subdeltoid bursa.

Glenohumeral Joint: Degenerative change is seen with near complete
denuding of hyaline cartilage, a few small subchondral cysts in the
glenoid and a small osteophyte off the humeral head. There is a
glenohumeral joint effusion containing debris.

Labrum:  The superior and posterior labrum are markedly degenerated.

Bones:  No fracture, contusion or worrisome lesion.

Other: None.
IMPRESSION: Complete supraspinatus and infraspinatus tendon tears with 2.5-3.5
cm of retraction. There is extensive edema in both muscle bellies
suggestive of acute tear. Septated cyst posterior and superior to
the musculotendinous junction of the supraspinatus is likely a
ganglion.

Severe subscapularis tendinopathy without tear.

Advanced glenohumeral osteoarthritis. There is a large glenohumeral
joint effusion containing debris.

Subacromial/subdeltoid bursitis is likely hemorrhagic.

Moderate to moderately severe acromioclavicular osteoarthritis.
